# Patient Record
Sex: Female | Born: 1985 | Race: White | Hispanic: No | Marital: Single | Smoking: Former smoker
Health system: Southern US, Community
[De-identification: ages and names within clinical notes are randomized; demographics above are authoritative.]

## PROBLEM LIST (undated history)

## (undated) DIAGNOSIS — I82409 Acute embolism and thrombosis of unspecified deep veins of unspecified lower extremity: Secondary | ICD-10-CM

---

## 2011-09-03 ENCOUNTER — Emergency Department (HOSPITAL_BASED_OUTPATIENT_CLINIC_OR_DEPARTMENT_OTHER)
Admission: EM | Admit: 2011-09-03 | Discharge: 2011-09-03 | Disposition: A | Discharge status: Home / Self Care | Payer: Medicaid Other | Attending: Emergency Medicine | Admitting: Emergency Medicine

## 2011-09-03 ENCOUNTER — Emergency Department (HOSPITAL_BASED_OUTPATIENT_CLINIC_OR_DEPARTMENT_OTHER): Payer: Medicaid Other

## 2011-09-03 ENCOUNTER — Encounter (HOSPITAL_BASED_OUTPATIENT_CLINIC_OR_DEPARTMENT_OTHER): Payer: Self-pay | Admitting: *Deleted

## 2011-09-03 DIAGNOSIS — R0789 Other chest pain: Secondary | ICD-10-CM

## 2011-09-03 DIAGNOSIS — R079 Chest pain, unspecified: Secondary | ICD-10-CM | POA: Insufficient documentation

## 2011-09-03 HISTORY — DX: Acute embolism and thrombosis of unspecified deep veins of unspecified lower extremity: I82.409

## 2011-09-03 LAB — CBC WITH DIFFERENTIAL/PLATELET
Basophils Absolute: 0 10*3/uL (ref 0.0–0.1)
Lymphocytes Relative: 35 % (ref 12–46)
Lymphs Abs: 3.1 10*3/uL (ref 0.7–4.0)
Neutro Abs: 4.5 10*3/uL (ref 1.7–7.7)
Platelets: 217 10*3/uL (ref 150–400)
RBC: 4.6 MIL/uL (ref 3.87–5.11)
RDW: 12.9 % (ref 11.5–15.5)
WBC: 8.8 10*3/uL (ref 4.0–10.5)

## 2011-09-03 LAB — BASIC METABOLIC PANEL
CO2: 25 mEq/L (ref 19–32)
Chloride: 104 mEq/L (ref 96–112)
Potassium: 3.7 mEq/L (ref 3.5–5.1)
Sodium: 139 mEq/L (ref 135–145)

## 2011-09-03 LAB — D-DIMER, QUANTITATIVE: D-Dimer, Quant: 0.32 ug/mL-FEU (ref 0.00–0.48)

## 2011-09-03 LAB — TROPONIN I: Troponin I: <0.3 ng/mL (ref <0.30)

## 2011-09-03 LAB — PREGNANCY, URINE: Preg Test, Ur: NEGATIVE

## 2011-09-03 MED ORDER — IBUPROFEN 600 MG PO TABS: 600.0000 mg | ORAL_TABLET | Freq: Four times a day (QID) | ORAL | Status: AC | PRN

## 2011-09-03 NOTE — ED Notes (Signed)
Pt reports sudden onset left chest pains, directly underneath left breast, that began approx 14hours ago. States she was sitting at the time of onset. Worsens with deep breathing. Has been constant. Denies any other associated symptoms. Pt has been doing increased heavy lifting and strenuous activity over the past two weeks.

## 2011-09-03 NOTE — ED Provider Notes (Signed)
History     CSN: [id number]  Arrival date & time 09/03/11  0554   None     Chief Complaint  Patient presents with  . Chest Pain    (Consider location/radiation/quality/duration/timing/severity/associated sxs/prior treatment) Patient is a 26 y.o. female presenting with chest pain. The history is provided by the patient.  Chest Pain The chest pain began 12 - 24 hours ago. Duration of episode(s) is 14 hours. Chest pain occurs constantly. The chest pain is unchanged. The pain is associated with breathing. At its most intense, the pain is at 2/10. The pain is currently at 2/10. The severity of the pain is mild. The quality of the pain is described as sharp. The pain does not radiate. Chest pain is worsened by deep breathing. Pertinent negatives for primary symptoms include no shortness of breath, no cough and no palpitations.  Pertinent negatives for associated symptoms include no lower extremity edema. She tried nothing for the symptoms. Risk factors include obesity.  Pertinent negatives for past medical history include no aneurysm.  Pertinent negatives for family medical history include: no Marfan's syndrome in family.  Procedure history is negative for cardiac catheterization.   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    No past medical history on file.  No past surgical history on file.  No family history on file.  History  Substance Use Topics  . Smoking status: Not on file  . Smokeless tobacco: Not on file  . Alcohol Use: Not on file    OB History    No data available      Review of Systems  Respiratory: Negative for cough and shortness of breath.   Cardiovascular: Positive for chest pain. Negative for palpitations and leg swelling.  All other systems reviewed and are negative.    Allergies  Review of patient's allergies indicates not on file.  Home Medications  No current  outpatient prescriptions on file.  There were no vitals taken for this visit.  Physical Exam  Constitutional: She is oriented to person, place, and time. She appears well-developed and well-nourished.  HENT:  Head: Normocephalic and atraumatic.  Mouth/Throat: Oropharynx is clear and moist.  Eyes: Conjunctivae are normal. Pupils are equal, round, and reactive to light.  Neck: Normal range of motion. Neck supple.  Cardiovascular: Normal rate and regular rhythm.   Pulmonary/Chest: Effort normal and breath sounds normal. She has no wheezes. She has no rales.  Abdominal: Soft. Bowel sounds are normal. There is no tenderness. There is no rebound and no guarding.  Musculoskeletal: Normal range of motion.  Neurological: She is alert and oriented to person, place, and time.  Skin: Skin is warm and dry. She is not diaphoretic.  Psychiatric: She has a normal mood and affect.    ED Course  Procedures (including critical care time)   Labs Reviewed  PREGNANCY, URINE  CBC WITH DIFFERENTIAL  BASIC METABOLIC PANEL  TROPONIN I  D-DIMER, QUANTITATIVE   No results found.   No diagnosis found.    MDM  MSK pain given history, and exam. Given the onset of symptoms constant > 8 hours one negative EKG and one negative troponin are sufficient to r/o ACS.  Follow up with your family doctor return for chest pain shortness of breath fevers cough or any concerns.  Patient verbalizes understanding and agrees to follow up    Date: 09/03/2011  Rate: 72  Rhythm: normal sinus rhythm  QRS Axis: normal  Intervals: normal  ST/T Wave abnormalities: normal  Conduction Disutrbances: none  Narrative Interpretation: unremarkable         Kelina Beauchamp K Jermichael Belmares-Rasch, MD 09/03/11 [phone]

## 2011-09-03 NOTE — ED Notes (Signed)
Return from XR

## 2011-09-21 ENCOUNTER — Encounter (HOSPITAL_BASED_OUTPATIENT_CLINIC_OR_DEPARTMENT_OTHER): Payer: Self-pay | Admitting: *Deleted

## 2011-09-21 ENCOUNTER — Emergency Department (HOSPITAL_BASED_OUTPATIENT_CLINIC_OR_DEPARTMENT_OTHER)
Admission: EM | Admit: 2011-09-21 | Discharge: 2011-09-21 | Disposition: A | Discharge status: Home / Self Care | Payer: Medicaid Other | Attending: Emergency Medicine | Admitting: Emergency Medicine

## 2011-09-21 DIAGNOSIS — Z87891 Personal history of nicotine dependence: Secondary | ICD-10-CM | POA: Insufficient documentation

## 2011-09-21 DIAGNOSIS — Z86718 Personal history of other venous thrombosis and embolism: Secondary | ICD-10-CM | POA: Insufficient documentation

## 2011-09-21 DIAGNOSIS — H81399 Other peripheral vertigo, unspecified ear: Secondary | ICD-10-CM | POA: Insufficient documentation

## 2011-09-21 LAB — URINALYSIS, ROUTINE W REFLEX MICROSCOPIC
Bilirubin Urine: NEGATIVE
Leukocytes, UA: NEGATIVE
Nitrite: NEGATIVE
Specific Gravity, Urine: 1.031 — ABNORMAL HIGH (ref 1.005–1.030)
Urobilinogen, UA: 0.2 mg/dL (ref 0.0–1.0)

## 2011-09-21 LAB — GLUCOSE, CAPILLARY: Glucose-Capillary: 104 mg/dL — ABNORMAL HIGH (ref 70–99)

## 2011-09-21 LAB — PREGNANCY, URINE: Preg Test, Ur: NEGATIVE

## 2011-09-21 MED ORDER — MECLIZINE HCL 50 MG PO TABS: 50.0000 mg | ORAL_TABLET | Freq: Three times a day (TID) | ORAL | Status: AC | PRN

## 2011-09-21 MED ORDER — SODIUM CHLORIDE 0.9 % IV BOLUS (SEPSIS)
1000.0000 mL | Freq: Once | INTRAVENOUS | Status: AC
  Administered 2011-09-21: 1000 mL via INTRAVENOUS

## 2011-09-21 MED ORDER — DIAZEPAM 5 MG/ML IJ SOLN
5.0000 mg | Freq: Once | INTRAMUSCULAR | Status: DC
  Filled 2011-09-21: qty 2

## 2011-09-21 NOTE — ED Provider Notes (Signed)
History     CSN: [id number]  Arrival date & time 09/21/11  0035   First MD Initiated Contact with Patient 09/21/11 0149      Chief Complaint  Patient presents with  . Dizziness    (Consider location/radiation/quality/duration/timing/severity/associated sxs/prior treatment) HPI This is a 26 year old white female who had the sudden onset of dizziness after lying down to go to sleep. This occurred just prior to arrival. She describes the dizziness as "things moving to the left". It was accompanied by nausea but no vomiting. She felt hot. It was exacerbated by movements of the head. She had 2 acute episodes with lesser symptoms persisting. She did have an episode of diarrhea subsequently. She has no history of vertigo. She denies ear pain or tinnitus at this time but states she did have some left ear pain several days ago. Yesterday afternoon she felt unusually thirsty. Her blood sugar was checked here and I be 105. She has no history of diabetes.  Past Medical History  Diagnosis Date  . DVT (deep venous thrombosis)     Past Surgical History  Procedure Date  . Cesarean section   . Cesarean section     History reviewed. No pertinent family history.  History  Substance Use Topics  . Smoking status: Former Games developer  . Smokeless tobacco: Not on file  . Alcohol Use: No    OB History    Grav Para Term Preterm Abortions TAB SAB Ect Mult Living                  Review of Systems  All other systems reviewed and are negative.    Allergies  Review of patient's allergies indicates no known allergies.  Home Medications  No current outpatient prescriptions on file.  BP 135/72  Pulse 89  Temp 97.9 F (36.6 C)  Resp 18  Ht 5\' 4"  (1.626 m)  Wt 400 lb (181.439 kg)  BMI 68.66 kg/m2  SpO2 99%  LMP 08/13/2011  Physical Exam General: Well-developed, obese female in no acute distress; appearance consistent with age of record HENT: normocephalic, atraumatic; normal-appearing  tympanic membranes Eyes: pupils equal round and reactive to light; extraocular muscles intact; no metastatic Neck: supple Heart: regular rate and rhythm Lungs: clear to auscultation bilaterally Abdomen: soft; nondistended Extremities: No deformity; full range of motion Neurologic: Awake, alert and oriented; motor function intact in all extremities and symmetric; no facial droop; normal coordination speech Skin: Warm and dry Psychiatric: Normal mood and affect    ED Course  Procedures (including critical care time)     MDM   Nursing notes and vitals signs, including pulse oximetry, reviewed.  Summary of this visit's results, reviewed by myself:  Labs:  Results for orders placed during the hospital encounter of 09/21/11  URINALYSIS, ROUTINE W REFLEX MICROSCOPIC      Component Value Range   Color, Urine YELLOW  YELLOW   APPearance CLEAR  CLEAR   Specific Gravity, Urine 1.031 (*) 1.005 - 1.030   pH 5.0  5.0 - 8.0   Glucose, UA NEGATIVE  NEGATIVE mg/dL   Hgb urine dipstick NEGATIVE  NEGATIVE   Bilirubin Urine NEGATIVE  NEGATIVE   Ketones, ur NEGATIVE  NEGATIVE mg/dL   Protein, ur NEGATIVE  NEGATIVE mg/dL   Urobilinogen, UA 0.2  0.0 - 1.0 mg/dL   Nitrite NEGATIVE  NEGATIVE   Leukocytes, UA NEGATIVE  NEGATIVE  GLUCOSE, CAPILLARY      Component Value Range   Glucose-Capillary 104 (*) 70 -  99 mg/dL   0:98 AM Asymptomatic at this time after Valium 5 mg IV.         Hanley Seamen, MD 09/21/11 [phone]

## 2011-09-21 NOTE — ED Notes (Signed)
Pt c/o dizzy, " hot flashes" diarrhea x 30 mins

## 2011-12-12 ENCOUNTER — Encounter (HOSPITAL_BASED_OUTPATIENT_CLINIC_OR_DEPARTMENT_OTHER): Payer: Self-pay | Admitting: *Deleted

## 2011-12-12 ENCOUNTER — Emergency Department (HOSPITAL_BASED_OUTPATIENT_CLINIC_OR_DEPARTMENT_OTHER)
Admission: EM | Admit: 2011-12-12 | Discharge: 2011-12-12 | Disposition: A | Discharge status: Home / Self Care | Payer: Medicaid Other | Attending: Emergency Medicine | Admitting: Emergency Medicine

## 2011-12-12 DIAGNOSIS — R232 Flushing: Secondary | ICD-10-CM

## 2011-12-12 DIAGNOSIS — Z79899 Other long term (current) drug therapy: Secondary | ICD-10-CM | POA: Insufficient documentation

## 2011-12-12 DIAGNOSIS — Z86718 Personal history of other venous thrombosis and embolism: Secondary | ICD-10-CM | POA: Insufficient documentation

## 2011-12-12 DIAGNOSIS — Z87891 Personal history of nicotine dependence: Secondary | ICD-10-CM | POA: Insufficient documentation

## 2011-12-12 LAB — URINE MICROSCOPIC-ADD ON

## 2011-12-12 LAB — CBC WITH DIFFERENTIAL/PLATELET
Eosinophils Relative: 2 % (ref 0–5)
HCT: 36.8 % (ref 36.0–46.0)
Lymphocytes Relative: 35 % (ref 12–46)
Lymphs Abs: 2.7 10*3/uL (ref 0.7–4.0)
MCV: 89.3 fL (ref 78.0–100.0)
Monocytes Absolute: 0.8 10*3/uL (ref 0.1–1.0)
RBC: 4.12 MIL/uL (ref 3.87–5.11)
RDW: 12.8 % (ref 11.5–15.5)
WBC: 7.6 10*3/uL (ref 4.0–10.5)

## 2011-12-12 LAB — BASIC METABOLIC PANEL
CO2: 24 mEq/L (ref 19–32)
Calcium: 8.9 mg/dL (ref 8.4–10.5)
Creatinine, Ser: 0.8 mg/dL (ref 0.50–1.10)
Glucose, Bld: 92 mg/dL (ref 70–99)

## 2011-12-12 LAB — URINALYSIS, ROUTINE W REFLEX MICROSCOPIC
Ketones, ur: 15 mg/dL — AB
Nitrite: NEGATIVE
Protein, ur: NEGATIVE mg/dL
pH: 5.5 (ref 5.0–8.0)

## 2011-12-12 NOTE — ED Notes (Signed)
Pt states for the past 2 weeks, she has had 3 episodes of feeling dizzy, "flashes" of heat, nausea and near syncope. @ of these happened after a high carb meal and drinking OJ. Worried she was a diabetic. Monitored her sugars, but they were OK. Also they happened 2 days after starting Medroxyprogesterone. Only took for 5 days. Tonight she could not sleep. Felt anxious.

## 2011-12-12 NOTE — ED Provider Notes (Addendum)
History     CSN: [id number]  Arrival date & time 12/12/11  1308   First MD Initiated Contact with Patient 12/12/11 0100      Chief Complaint  Patient presents with  . Dizziness    (Consider location/radiation/quality/duration/timing/severity/associated sxs/prior treatment) The history is provided by the patient. No language interpreter was used.  Patient describes lightheadedness for 5-7 days following laying down.  Usually after a high carb meal.  Patient has concern that she is diabetic.  No polyuria, nor polydipsia.  No CP, no SOB no DOE.  Symptoms no not happen when the patient is ambulating.  Feels hot all over and like she is flushed.  No cough.  No weakness no numbness.  No rashes on the skin.  No travel.  No vomiting no diarrhea.  No urinary symptoms.  Stated she had been checking her sugars at home and they were normal.    Past Medical History  Diagnosis Date  . DVT (deep venous thrombosis)     Past Surgical History  Procedure Date  . Cesarean section   . Cesarean section     History reviewed. No pertinent family history.  History  Substance Use Topics  . Smoking status: Former Games developer  . Smokeless tobacco: Not on file  . Alcohol Use: No    OB History    Grav Para Term Preterm Abortions TAB SAB Ect Mult Living                  Review of Systems  Constitutional: Negative for chills and diaphoresis.  HENT: Negative for neck pain and neck stiffness.   Respiratory: Negative for shortness of breath and wheezing.   Cardiovascular: Negative for chest pain and leg swelling.  Skin: Negative for rash.  Neurological: Positive for light-headedness. Negative for facial asymmetry, weakness, numbness and headaches.  All other systems reviewed and are negative.    Allergies  Review of patient's allergies indicates no known allergies.  Home Medications   Current Outpatient Rx  Name  Route  Sig  Dispense  Refill  . MEDROXYPROGESTERONE ACETATE 10 MG PO TABS    Oral   Take 20 mg by mouth daily.           BP 130/70  Pulse 75  Temp 97.8 F (36.6 C)  Resp 20  Ht 5\' 5"  (1.651 m)  Wt 400 lb (181.439 kg)  BMI 66.56 kg/m2  SpO2 100%  LMP 12/12/2011  Physical Exam  Constitutional: She is oriented to person, place, and time. She appears well-developed and well-nourished. No distress.  HENT:  Head: Normocephalic and atraumatic.  Right Ear: Tympanic membrane is not injected.  Left Ear: Tympanic membrane is not injected.  Mouth/Throat: Oropharynx is clear and moist.  Eyes: Conjunctivae normal and EOM are normal. Pupils are equal, round, and reactive to light.  Neck: Normal range of motion. Neck supple.  Cardiovascular: Normal rate, regular rhythm and intact distal pulses.   Pulmonary/Chest: Effort normal and breath sounds normal. She has no wheezes. She has no rales.  Abdominal: Soft. Bowel sounds are normal. There is no tenderness. There is no rebound and no guarding.  Musculoskeletal: Normal range of motion.  Neurological: She is alert and oriented to person, place, and time. She has normal reflexes.  Skin: Skin is warm and dry.    ED Course  Procedures (including critical care time)  Labs Reviewed  URINALYSIS, ROUTINE W REFLEX MICROSCOPIC - Abnormal; Notable for the following:    Color, Urine AMBER (*)  BIOCHEMICALS MAY BE AFFECTED BY COLOR   APPearance TURBID (*)     Hgb urine dipstick LARGE (*)     Bilirubin Urine SMALL (*)     Ketones, ur 15 (*)     All other components within normal limits  URINE MICROSCOPIC-ADD ON - Abnormal; Notable for the following:    Squamous Epithelial / LPF MANY (*)     Bacteria, UA MANY (*)     All other components within normal limits  BASIC METABOLIC PANEL - Abnormal; Notable for the following:    Potassium 3.4 (*)     All other components within normal limits  PREGNANCY, URINE  GLUCOSE, CAPILLARY  CBC WITH DIFFERENTIAL  URINE CULTURE   No results found.   1. Vasomotor flushing        MDM   Date: 12/12/2011  Rate:72  Rhythm: normal sinus rhythm  QRS Axis: normal  Intervals: normal  ST/T Wave abnormalities: normal  Conduction Disutrbances: none  Narrative Interpretation: unremarkable  Suspect anxiety as cause. No indication for imaging at this time.  Return for weakness numbness changes in speech. Chest pain shortness of breath or any concerns.    Case d/w Dr. Lovell Sheehan, call office in am to be seen         Taitum Alms K Allani Reber-Rasch, MD 12/12/11 0235  Loda Bialas K Dyllon Henken-Rasch, MD 12/12/11 0236

## 2011-12-12 NOTE — ED Notes (Signed)
MD at bedside. 

## 2011-12-13 LAB — URINE CULTURE

## 2011-12-14 NOTE — ED Notes (Signed)
+   Urine Chart sent to EDP office for review. 

## 2011-12-18 NOTE — ED Notes (Signed)
Chart returned from EDP office. Per [employer] PA-C, not a clean catch--Diptheioids are likely contaminant. No treatment.

## 2012-12-18 ENCOUNTER — Emergency Department (HOSPITAL_BASED_OUTPATIENT_CLINIC_OR_DEPARTMENT_OTHER)
Admission: EM | Admit: 2012-12-18 | Discharge: 2012-12-18 | Disposition: A | Discharge status: Home / Self Care | Payer: Medicaid Other | Attending: Emergency Medicine | Admitting: Emergency Medicine

## 2012-12-18 ENCOUNTER — Encounter (HOSPITAL_BASED_OUTPATIENT_CLINIC_OR_DEPARTMENT_OTHER): Payer: Self-pay | Admitting: Emergency Medicine

## 2012-12-18 DIAGNOSIS — Z8669 Personal history of other diseases of the nervous system and sense organs: Secondary | ICD-10-CM | POA: Insufficient documentation

## 2012-12-18 DIAGNOSIS — Z86718 Personal history of other venous thrombosis and embolism: Secondary | ICD-10-CM | POA: Insufficient documentation

## 2012-12-18 DIAGNOSIS — R209 Unspecified disturbances of skin sensation: Secondary | ICD-10-CM | POA: Insufficient documentation

## 2012-12-18 DIAGNOSIS — R202 Paresthesia of skin: Secondary | ICD-10-CM

## 2012-12-18 DIAGNOSIS — Z8739 Personal history of other diseases of the musculoskeletal system and connective tissue: Secondary | ICD-10-CM | POA: Insufficient documentation

## 2012-12-18 DIAGNOSIS — H532 Diplopia: Secondary | ICD-10-CM | POA: Insufficient documentation

## 2012-12-18 NOTE — ED Notes (Addendum)
Left side facial numbness x 1 week-also c/o blurred vision x 1 year

## 2012-12-18 NOTE — ED Provider Notes (Addendum)
CSN: [id number]     Arrival date & time 12/18/12  2118 History   First MD Initiated Contact with Patient 12/18/12 2141     This chart was scribed for Rolan Bucco, MD by Manuela Schwartz, ED scribe. This patient was seen in room MH12/MH12 and the patient's care was started at 2141.  Chief Complaint  Patient presents with  . Numbness   The history is provided by the patient. No language interpreter was used.   HPI Comments: Felicia Michael is a 27 y.o. female who presents to the Emergency Department complaining of intermittent paraesthesias over the left side of her face which radiates to her left chin and left forehead, onset 2 weeks ago and worsened this PM. She states has not had this problem before and denies any injury or trauma to her face. She denies associated drooling, change in speech, facial drooping, off-balance issues, numbness or weakness of her arms/legs, HA. She states her sx are worse when laying down on her back but better when she is up and moving around or being active. She reports frequent ear infections but states her ear currently feels fine. 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  SHe has had some intermittent double vision that started one year ago after "hormone therapy".  No vision changes currently.  PCP Dr. Lovell Sheehan   Past Medical History  Diagnosis Date  . DVT (deep venous thrombosis)    Past Surgical History  Procedure Laterality Date  . Cesarean section    . Cesarean section     No family history on file. History  Substance Use Topics  . Smoking status: Former Games developer  . Smokeless tobacco: Not on file  . Alcohol Use: No   OB History   Grav Para Term Preterm Abortions TAB SAB Ect Mult Living                 Review of Systems  Constitutional: Negative for fever and chills.  HENT: Negative for congestion and rhinorrhea.   Respiratory: Negative for cough and shortness of breath.    Cardiovascular: Negative for chest pain.  Gastrointestinal: Negative for nausea, vomiting, abdominal pain and diarrhea.  Musculoskeletal: Negative for back pain.  Skin: Negative for color change and rash.  Neurological: Positive for numbness (parasthesias over her left face). Negative for dizziness, syncope, facial asymmetry, speech difficulty, weakness and headaches.  All other systems reviewed and are negative.   A complete 10 system review of systems was obtained and all systems are negative except as noted in the HPI and PMH.   Allergies  Review of patient's allergies indicates no known allergies.  Home Medications   Current Outpatient Rx  Name  Route  Sig  Dispense  Refill  . medroxyPROGESTERone (PROVERA) 10 MG tablet   Oral   Take 20 mg by mouth daily.          Triage Vitals: BP 161/68  Pulse 89  Temp(Src) 97.9 F (36.6 C) (Oral)  Resp 16  Ht 5\' 5"  (1.651 m)  Wt 390 lb (176.903 kg)  BMI 64.90 kg/m2  SpO2 100%  LMP 11/27/2012 Physical Exam  Nursing note and vitals reviewed. Constitutional: She is oriented to person, place, and time. She appears well-developed and well-nourished.  HENT:  Head: Normocephalic and atraumatic.  Eyes: Pupils are equal, round, and reactive to light.  Neck: Normal range of motion. Neck supple.  Cardiovascular: Normal rate,  regular rhythm and normal heart sounds.   Pulmonary/Chest: Effort normal and breath sounds normal. No respiratory distress. She has no wheezes. She has no rales. She exhibits no tenderness.  Abdominal: Soft. Bowel sounds are normal. There is no tenderness. There is no rebound and no guarding.  Musculoskeletal: Normal range of motion. She exhibits no edema.  Lymphadenopathy:    She has no cervical adenopathy.  Neurological: She is alert and oriented to person, place, and time.  Motor 5/5 all extremities, sensation grossly intact to LT all extremities.  No pronator drift.  FTN intact.  No facial drooping.  Normal  sensation to face to LT.  Gait normal  Skin: Skin is warm and dry. No rash noted.  Psychiatric: She has a normal mood and affect.    ED Course  Procedures (including critical care time) DIAGNOSTIC STUDIES: Oxygen Saturation is 100% on room air, normal by my interpretation.    COORDINATION OF CARE: At 950 PM PM Discussed treatment plan with patient which includes plan to d/c home and f/u w/her PCP. Patient agrees.   Labs Review Labs Reviewed - No data to display Imaging Review No results found.  EKG Interpretation   None       MDM   1. Paresthesia    Pt with intermittent tingling to left face, at times involves forehead.  No assoicated headaches.  No other associated neuro deficits.  Feel that this is likely a peripheral nerve irritation.  Nothing suggestive of CVA, other intracranial process.  Advised to f/lu with her PMD, if symptoms continue or worsen, may need MRI, but don't feel that this is indicated at this point.  I personally performed the services described in this documentation, which was scribed in my presence.  The recorded information has been reviewed and considered.      Rolan Bucco, MD 12/18/12 1610  Rolan Bucco, MD 12/18/12 2218

## 2015-01-27 ENCOUNTER — Emergency Department (HOSPITAL_BASED_OUTPATIENT_CLINIC_OR_DEPARTMENT_OTHER)
Admission: EM | Admit: 2015-01-27 | Discharge: 2015-01-27 | Disposition: A | Discharge status: Home / Self Care | Payer: Self-pay | Attending: Emergency Medicine | Admitting: Emergency Medicine

## 2015-01-27 ENCOUNTER — Emergency Department (HOSPITAL_BASED_OUTPATIENT_CLINIC_OR_DEPARTMENT_OTHER): Payer: Self-pay

## 2015-01-27 ENCOUNTER — Encounter (HOSPITAL_BASED_OUTPATIENT_CLINIC_OR_DEPARTMENT_OTHER): Payer: Self-pay

## 2015-01-27 DIAGNOSIS — Z87891 Personal history of nicotine dependence: Secondary | ICD-10-CM | POA: Insufficient documentation

## 2015-01-27 DIAGNOSIS — M79605 Pain in left leg: Secondary | ICD-10-CM

## 2015-01-27 DIAGNOSIS — Z86718 Personal history of other venous thrombosis and embolism: Secondary | ICD-10-CM | POA: Insufficient documentation

## 2015-01-27 DIAGNOSIS — M79662 Pain in left lower leg: Secondary | ICD-10-CM | POA: Insufficient documentation

## 2015-01-27 DIAGNOSIS — Z79891 Long term (current) use of opiate analgesic: Secondary | ICD-10-CM | POA: Insufficient documentation

## 2015-01-27 NOTE — Discharge Instructions (Signed)

## 2015-01-27 NOTE — ED Provider Notes (Signed)
CSN: [id number]     Arrival date & time 01/27/15  [id number] History   First MD Initiated Contact with Patient 01/27/15 0630     Chief Complaint  Patient presents with  . Leg Pain     (Consider location/radiation/quality/duration/timing/severity/associated sxs/prior Treatment) HPI Comments: Patient is a 29 year old female with history of morbid obesity and prior DVT. She presents with complaints of pain in her left calf that started 2 days ago. She reports she was riding in the car for several hours over the weekend. She reports experiencing some swelling in both legs, however is now having pain in her left calf. She denies to me she is expressing any chest pain or shortness of breath. She denies any other injury or trauma.  Patient is a 29 y.o. female presenting with leg pain. The history is provided by the patient.  Leg Pain Lower extremity pain location: Calf. Time since incident:  2 days Injury: no   Pain details:    Quality:  Cramping   Radiates to:  Does not radiate   Severity:  Moderate   Timing:  Constant   Progression:  Unchanged Chronicity:  New Relieved by:  Nothing Worsened by:  Activity and bearing weight   Past Medical History  Diagnosis Date  . DVT (deep venous thrombosis) [facility](HCC)    Past Surgical History  Procedure Laterality Date  . Cesarean section    . Cesarean section     No family history on file. Social History  Substance Use Topics  . Smoking status: Former Games developermoker  . Smokeless tobacco: None  . Alcohol Use: No   OB History    No data available     Review of Systems  All other systems reviewed and are negative.     Allergies  Review of patient's allergies indicates no known allergies.  Home Medications   Prior to Admission medications   Medication Sig Start Date End Date Taking? Authorizing Provider  medroxyPROGESTERone (PROVERA) 10 MG tablet Take 20 mg by mouth daily.    Historical Provider, MD   BP 115/99 mmHg  Pulse 74  Temp(Src) 98 F  (36.7 C) (Oral)  Resp 22  Wt 431 lb (195.5 kg)  SpO2 100% Physical Exam  Constitutional: She is oriented to person, place, and time. She appears well-developed and well-nourished. No distress.  HENT:  Head: Normocephalic and atraumatic.  Neck: Normal range of motion. Neck supple.  Cardiovascular: Normal rate and regular rhythm.  Exam reveals no gallop and no friction rub.   No murmur heard. Pulmonary/Chest: Effort normal and breath sounds normal. No respiratory distress. She has no wheezes.  Abdominal: Soft. Bowel sounds are normal. She exhibits no distension. There is no tenderness.  Musculoskeletal: Normal range of motion. She exhibits no edema.  There is mild tenderness to the posterior aspect of the left calf. There is no other obvious abnormality. DP pulses are palpable bilaterally. Homans sign is absent bilaterally. The leg exam is somewhat obscured secondary to obesity.  Neurological: She is alert and oriented to person, place, and time.  Skin: Skin is warm and dry. She is not diaphoretic.  Nursing note and vitals reviewed.   ED Course  Procedures (including critical care time) Labs Review Labs Reviewed - No data to display  Imaging Review No results found. I have personally reviewed and evaluated these images and lab results as part of my medical decision-making.   EKG Interpretation None      MDM   Final diagnoses:  None  Patient will remain in the emergency department for an ultrasound. The ultrasound techs come in today at 8 AM. Care will be signed out to the oncoming provider at shift change who will obtain the results of this study and determine the appropriate treatment and disposition.    Geoffery Lyons, MD 01/28/15 [phone]

## 2015-01-27 NOTE — ED Notes (Signed)
Left lower calf pain since Friday.  States her ankles were really swollen on Thursday and the next day she had the pain.  She did drive 6 hours in the car without stopping on Thursday, has a hx of DVTs.  Area of pain is warm, no significant swelling appreciated, distal pulses present.

## 2015-02-24 ENCOUNTER — Encounter (HOSPITAL_BASED_OUTPATIENT_CLINIC_OR_DEPARTMENT_OTHER): Payer: Self-pay | Admitting: *Deleted

## 2015-02-24 ENCOUNTER — Emergency Department (HOSPITAL_BASED_OUTPATIENT_CLINIC_OR_DEPARTMENT_OTHER): Payer: Self-pay

## 2015-02-24 ENCOUNTER — Emergency Department (HOSPITAL_BASED_OUTPATIENT_CLINIC_OR_DEPARTMENT_OTHER)
Admission: EM | Admit: 2015-02-24 | Discharge: 2015-02-24 | Disposition: A | Discharge status: Home / Self Care | Payer: Self-pay | Attending: Emergency Medicine | Admitting: Emergency Medicine

## 2015-02-24 DIAGNOSIS — Z793 Long term (current) use of hormonal contraceptives: Secondary | ICD-10-CM | POA: Insufficient documentation

## 2015-02-24 DIAGNOSIS — I809 Phlebitis and thrombophlebitis of unspecified site: Secondary | ICD-10-CM

## 2015-02-24 DIAGNOSIS — M79669 Pain in unspecified lower leg: Secondary | ICD-10-CM

## 2015-02-24 DIAGNOSIS — I8002 Phlebitis and thrombophlebitis of superficial vessels of left lower extremity: Secondary | ICD-10-CM | POA: Insufficient documentation

## 2015-02-24 DIAGNOSIS — Z86718 Personal history of other venous thrombosis and embolism: Secondary | ICD-10-CM | POA: Insufficient documentation

## 2015-02-24 DIAGNOSIS — Z87891 Personal history of nicotine dependence: Secondary | ICD-10-CM | POA: Insufficient documentation

## 2015-02-24 NOTE — ED Provider Notes (Addendum)
CSN: [id number]     Arrival date & time 02/24/15  0907 History   First MD Initiated Contact with Patient 02/24/15 [phone]     Chief Complaint  Patient presents with  . 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 She was seen in the ER proximal leg 1 month ago and had a negative DVT study. Since that time she has been using warm compresses, ibuprofen without resolution of her pain. She has had more notable pain and swelling in the ankle area with improvement of the calf pain over the last week. She denies fever, chest pain, shortness of breath. She does not use compression stocking  Patient is a 30 y.o. female presenting with leg pain. The history is provided by the patient.  Leg Pain Location:  Ankle and leg Time since incident:  1 month Injury: no   Leg location:  L lower leg Ankle location:  L ankle Pain details:    Quality:  Aching and shooting   Radiates to:  Does not radiate   Severity:  Moderate   Onset quality:  Gradual   Timing:  Constant   Progression:  Improving Chronicity:  Recurrent Relieved by:  NSAIDs (warm compresses) Worsened by:  Activity Associated symptoms: swelling   Associated symptoms: no decreased ROM, no muscle weakness and no numbness   Risk factors comment:  Prior DVT x2 in the left leg.   Past Medical History  Diagnosis Date  . DVT (deep venous thrombosis) [facility])    Past Surgical History  Procedure Laterality Date  . Cesarean section    . Cesarean section     No family history on file. Social History  Substance Use Topics  . Smoking status: Former Games developer  . Smokeless tobacco: None  . Alcohol Use: No   OB History    No data available     Review of Systems    All other systems reviewed and are negative.     Allergies  Review of patient's allergies indicates no known allergies.  Home Medications   Prior to Admission medications   Medication Sig Start Date End Date Taking? Authorizing Provider  medroxyPROGESTERone (PROVERA) 10 MG tablet Take 20 mg by mouth daily.    Historical Provider, MD   BP 172/85 mmHg  Pulse 85  Temp(Src) 98.3 F (36.8 C) (Oral)  Resp 20  Ht  (1.651 m)  Wt 430 lb (195.047 kg)  BMI 71.56 kg/m2  SpO2 99% Physical Exam  Constitutional: She is oriented to person, place, and time. She appears well-developed and well-nourished. No distress.  Morbidly obese  HENT:  Head: Normocephalic and atraumatic.  Eyes: EOM are normal. Pupils are equal, round, and reactive to light.  Cardiovascular: Normal rate.   Pulmonary/Chest: Effort normal.  Musculoskeletal: She exhibits tenderness.       Legs: Mild tenderness present in the left calf without notable swelling. Significant tenderness in the left lateral malleolus area with some tissue induration and swelling. 2+ DP and PT pulses noted. No warmth or erythema  Neurological: She is alert and oriented to person, place, and time.  Skin: Skin is warm and dry. No rash noted. No erythema.  Psychiatric: She has a normal mood and affect. Her behavior is normal.  Nursing note  and vitals reviewed.   ED Course  Procedures (including critical care time) Labs Review Labs Reviewed - No data to display  Imaging Review US Venous Img Lower Unilateral Left  02/24/2015  CLINICAL DATA:  Left calf pain radiating to the ankle since 12/23. History of prior DVT. Evaluate for acute or chronic DVT. EXAM: LEFT LOWER EXTREMITY VENOUS DOPPLER ULTRASOUND TECHNIQUE: Gray-scale sonography with graded compression, as well as color Doppler and duplex ultrasound were performed to evaluate the lower extremity deep venous systems from the level of the common femoral vein and including the common  femoral, femoral, profunda femoral, popliteal and calf veins including the posterior tibial, peroneal and gastrocnemius veins when visible. The superficial great saphenous vein was also interrogated. Spectral Doppler was utilized to evaluate flow at rest and with distal augmentation maneuvers in the common femoral, femoral and popliteal veins. COMPARISON:  None. FINDINGS: Contralateral Common Femoral Vein: Respiratory phasicity is normal and symmetric with the symptomatic side. No evidence of thrombus. Normal compressibility. Common Femoral Vein: No evidence of thrombus. Normal compressibility, respiratory phasicity and response to augmentation. Saphenofemoral Junction: No evidence of thrombus. Normal compressibility and flow on color Doppler imaging. Profunda Femoral Vein: No evidence of thrombus. Normal compressibility and flow on color Doppler imaging. Femoral Vein: No evidence of thrombus. Normal compressibility, respiratory phasicity and response to augmentation. Popliteal Vein: No evidence of thrombus. Normal compressibility, respiratory phasicity and response to augmentation. Calf Veins: No evidence of thrombus. Normal compressibility and flow on color Doppler imaging. Superficial Great Saphenous Vein: No evidence of thrombus. Normal compressibility and flow on color Doppler imaging. Venous Reflux:  None. Other Findings: There is echogenic occlusive thrombus within the lesser saphenous vein extending from the level of the mid calf to the ankle. Note is made of several echogenic foci within the distal aspect of the thrombus (representative image 39) suggestive of calcifications and indicative of a chronic etiology. Note, the left lesser saphenous vein was not discretely imaged on the 01/27/2015 examination. IMPRESSION: 1. No evidence of acute or chronic DVT within left lower extremity. 2. Examination is positive for age-indeterminate though potentially chronic superficial thrombophlebitis within the lesser  saphenous vein at the level of the calf and ankle. Again, there is no extension of this superficial thrombophlebitis to the deep venous system of the left lower extremity. Electronically Signed   By: Simonne Come M.D.   On: 02/24/2015 10:28   I have personally reviewed and evaluated these images and lab results as part of my medical decision-making.   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 She was seen approximately one month ago for similar symptoms that started after 6-7 hour car trip. She says since that time she's had persistent pain. She has been elevating, warm compresses and ibuprofen with some improvement but she is concerned because the pain has continued. She denies any chest pain or shortness of breath concerning for PE. Initially it was thought the DVTs related to birth control which she does not take any longer. She is not currently on anticoagulation. On exam the majority of her pain is in the left lateral ankle with only minimal calf tenderness. Feel most likely patient's symptoms are related to thrombophlebitis or vein insufficiency from prior DVTs. However due to ongoing pain and noted swelling still present  we'll repeat venous ultrasound to ensure no DVTs. Patient denies any trauma that would require x-ray  10:40 AM Thrombophlebitis is present on the DVT study but no evidence of DVT. We'll recommend the patient continue NSAIDs and start using compression hose.   Gwyneth Sprout, MD 02/24/15 1041  Gwyneth Sprout, MD 02/24/15 1041

## 2015-02-24 NOTE — ED Notes (Signed)
MD at bedside. 

## 2015-02-24 NOTE — ED Notes (Signed)
Presents with LLE pain

## 2015-02-24 NOTE — ED Notes (Signed)
Presents with LLE pain, has left lateral tenderness at Lt ankle.

## 2015-02-24 NOTE — ED Notes (Signed)
Pt denies any chest pain or shortness of breath.

## 2015-02-24 NOTE — ED Notes (Signed)
Pt resting quietly on stretcher, informed that results have been provided, awaiting EDP evaluation

## 2017-01-15 ENCOUNTER — Other Ambulatory Visit: Payer: Self-pay

## 2017-01-15 ENCOUNTER — Encounter (HOSPITAL_BASED_OUTPATIENT_CLINIC_OR_DEPARTMENT_OTHER): Payer: Self-pay | Admitting: *Deleted

## 2017-01-15 ENCOUNTER — Emergency Department (HOSPITAL_BASED_OUTPATIENT_CLINIC_OR_DEPARTMENT_OTHER)
Admission: EM | Admit: 2017-01-15 | Discharge: 2017-01-16 | Disposition: A | Discharge status: Home / Self Care | Payer: Self-pay | Attending: Emergency Medicine | Admitting: Emergency Medicine

## 2017-01-15 DIAGNOSIS — Z87891 Personal history of nicotine dependence: Secondary | ICD-10-CM | POA: Insufficient documentation

## 2017-01-15 DIAGNOSIS — Z79899 Other long term (current) drug therapy: Secondary | ICD-10-CM | POA: Insufficient documentation

## 2017-01-15 DIAGNOSIS — R002 Palpitations: Secondary | ICD-10-CM | POA: Insufficient documentation

## 2017-01-15 NOTE — ED Triage Notes (Addendum)
Reports she woke with a headache and feeling flushed. States her heart was pounding. Had similar symptoms before when here Bp was elevated. States she took baby aspirin tonight also

## 2017-01-16 LAB — CBC WITH DIFFERENTIAL/PLATELET
Basophils Absolute: 0 10*3/uL (ref 0.0–0.1)
Basophils Relative: 1 %
EOS ABS: 0.1 10*3/uL (ref 0.0–0.7)
EOS PCT: 2 %
HCT: 39.5 % (ref 36.0–46.0)
Hemoglobin: 13 g/dL (ref 12.0–15.0)
LYMPHS ABS: 2.3 10*3/uL (ref 0.7–4.0)
LYMPHS PCT: 34 %
MCH: 29.9 pg (ref 26.0–34.0)
MCHC: 32.9 g/dL (ref 30.0–36.0)
MCV: 90.8 fL (ref 78.0–100.0)
MONO ABS: 0.6 10*3/uL (ref 0.1–1.0)
Monocytes Relative: 10 %
Neutro Abs: 3.6 10*3/uL (ref 1.7–7.7)
Neutrophils Relative %: 53 %
Platelets: 210 10*3/uL (ref 150–400)
RBC: 4.35 MIL/uL (ref 3.87–5.11)
RDW: 13.1 % (ref 11.5–15.5)
WBC: 6.6 10*3/uL (ref 4.0–10.5)

## 2017-01-16 LAB — BASIC METABOLIC PANEL
Anion gap: 7 (ref 5–15)
BUN: 13 mg/dL (ref 6–20)
CALCIUM: 8.7 mg/dL — AB (ref 8.9–10.3)
CO2: 26 mmol/L (ref 22–32)
CREATININE: 0.59 mg/dL (ref 0.44–1.00)
Chloride: 105 mmol/L (ref 101–111)
GFR calc Af Amer: >60 mL/min (ref >60)
GFR calc non Af Amer: >60 mL/min (ref >60)
GLUCOSE: 99 mg/dL (ref 65–99)
POTASSIUM: 3.9 mmol/L (ref 3.5–5.1)
SODIUM: 138 mmol/L (ref 135–145)

## 2017-01-16 NOTE — ED Notes (Signed)
Alert, NAD, calm, interactive, resps e/u, speaking in clear complete sentences, no dyspnea noted, skin W&D, VSS.   Here for episode of heart racing, CP, HA, light-headed, also mentions face red and tired. Sx onset upon waking at 2300. Felt fine prior to lying down. Mentions similar episode in October in which [facility] and dx'd with a panic attack.   Admits to continued HA. (Denies: current CP, sob, NVD, fever, recent illness, dizziness or visual changes). Also denies caffeine, medications or ETOH. [facility] into room[facility]

## 2017-01-16 NOTE — ED Provider Notes (Signed)
[facility] EMERGENCY DEPARTMENT Provider Note   CSN: [id number] Arrival date & time: 01/15/17  2333     History   Chief Complaint Chief Complaint  Patient presents with  . Hypertension    HPI Felicia Michael is a 31 y.o. female.  HPI  This is a 31 year old female who presents with palpitations.  Patient reports that she was asleep when she was awoken by a sensation that her heart was racing.  She states that it lasted 30 seconds to 1 minute.  She states she had a similar episode in October which she associated with anxiety.  However, today she has not felt anxious.  She denies any increase caffeine use, new medications or supplements.  She denies any chest pain, breath, abdominal pain, nausea, vomiting.  She reports that she feels completely normal now.  She did take her blood pressure at home and noted it to be elevated; however, she questions the fit of her cuff.  Past Medical History:  Diagnosis Date  . DVT (deep venous thrombosis) (HCC)     There are no active problems to display for this patient.   Past Surgical History:  Procedure Laterality Date  . CESAREAN SECTION    . CESAREAN SECTION      OB History    No data available       Home Medications    Prior to Admission medications   Medication Sig Start Date End Date Taking? Authorizing Provider  medroxyPROGESTERone (PROVERA) 10 MG tablet Take 20 mg by mouth daily.    [provider]    Family History No family history on file.  Social History Social History   Tobacco Use  . Smoking status: Former Games developermoker  . Smokeless tobacco: Never Used  Substance Use Topics  . Alcohol use: No  . Drug use: No     Allergies   Patient has no known allergies.   Review of Systems Review of Systems  Constitutional: Negative for fever.  Respiratory: Negative for shortness of breath.   Cardiovascular: Positive for palpitations. Negative for chest pain and leg swelling.  Gastrointestinal:  Negative for abdominal pain, nausea and vomiting.  All other systems reviewed and are negative.    Physical Exam Updated Vital Signs BP (!) 106/58   Pulse 88   Temp 98.6 F (37 C) (Oral)   Resp 13   Ht 5\' 6"  (1.676 m)   Wt (!) 208.7 kg (460 lb)   SpO2 99%   BMI 74.25 kg/m   Physical Exam  Constitutional: She is oriented to person, place, and time.  Morbidly obese, no acute distress  HENT:  Head: Normocephalic and atraumatic.  Cardiovascular: Normal rate, regular rhythm and normal heart sounds.  Pulmonary/Chest: Effort normal. No respiratory distress. She has no wheezes.  Abdominal: Soft. There is no tenderness.  Neurological: She is alert and oriented to person, place, and time.  Skin: Skin is warm and dry.  Psychiatric: She has a normal mood and affect.  Nursing note and vitals reviewed.    ED Treatments / Results  Labs (all labs ordered are listed, but only abnormal results are displayed) Labs Reviewed  BASIC METABOLIC PANEL - Abnormal; Notable for the following components:      Result Value   Calcium 8.7 (*)    All other components within normal limits  CBC WITH DIFFERENTIAL/PLATELET    EKG  EKG Interpretation  Date/Time:  Monday January 16 2017 00:46:29 EST Ventricular Rate:  88 PR Interval:  QRS Duration: 90 QT Interval:  385 QTC Calculation: 466 R Axis:   48 Text Interpretation:  Sinus rhythm Low voltage, precordial leads Confirmed by Ross MarcusHorton, Ellajane Stong ([id number]) on 01/16/2017 2:19:53 AM       Radiology No results found.  Procedures Procedures (including critical care time)  Medications Ordered in ED Medications - No data to display   Initial Impression / Assessment and Plan / ED Course  I have reviewed the triage vital signs and the nursing notes.  Pertinent labs & imaging results that were available during my care of the patient were reviewed by me and considered in my medical decision making (see chart for details).     Patient  presents with palpitations.  She is currently asymptomatic.  Nontoxic appearing.  Vital signs reassuring.  EKG without evidence of arrhythmia.  Heart rate is normal.  Basic lab work obtained.  No evidence of anemia or metabolic derangement.  Given absence of chest pain or shortness of breath, doubt ACS or PE.  Patient has remained asymptomatic on the monitor in the ED.  No notable arrhythmias.  Recommend follow-up with cardiology for long-term monitoring.  Avoid increased caffeine.  Final Clinical Impressions(s) / ED Diagnoses   Final diagnoses:  Palpitations    ED Discharge Orders    None       Breelyn Icard, Mayer Maskerourtney F, MD 01/16/17 [phone]

## 2017-03-20 DIAGNOSIS — E282 Polycystic ovarian syndrome: Secondary | ICD-10-CM | POA: Insufficient documentation

## 2017-03-20 DIAGNOSIS — N8500 Endometrial hyperplasia, unspecified: Secondary | ICD-10-CM | POA: Insufficient documentation

## 2017-06-08 DIAGNOSIS — R400 Somnolence: Secondary | ICD-10-CM | POA: Insufficient documentation

## 2017-09-04 IMAGING — US US EXTREM LOW VENOUS*L*
1 series · 13 of 24 positions shown · non-contrast
Comparison: None.

CLINICAL DATA: 29-year-old female with 5 day history of left calf
pain. Prior history of DVT.



[Series 1: us extrem low venous*left* · 0.11mm/px · 13 of 27 slices shown]
[im 1/27]
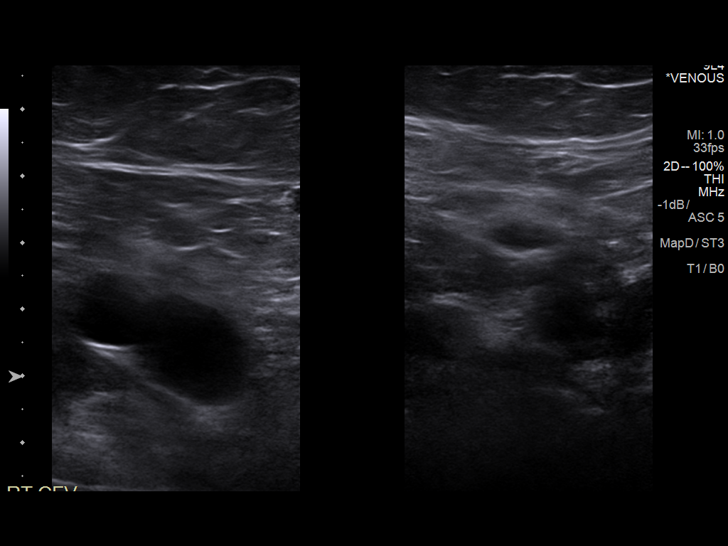
[im 3/27]
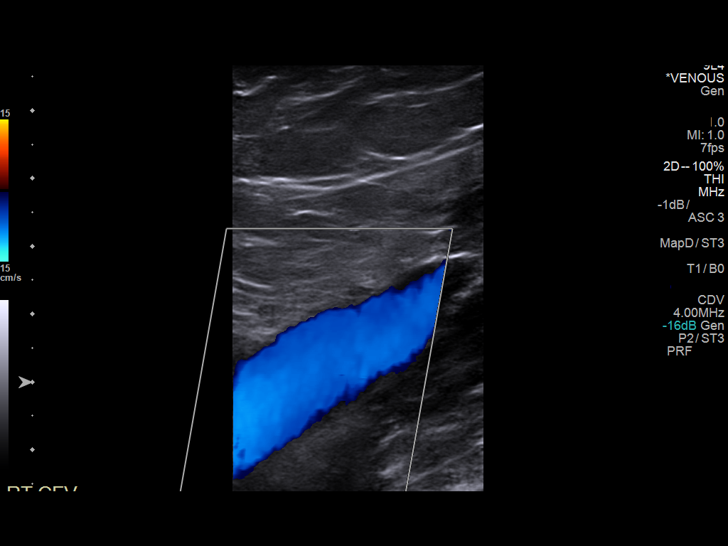
[im 5/27]
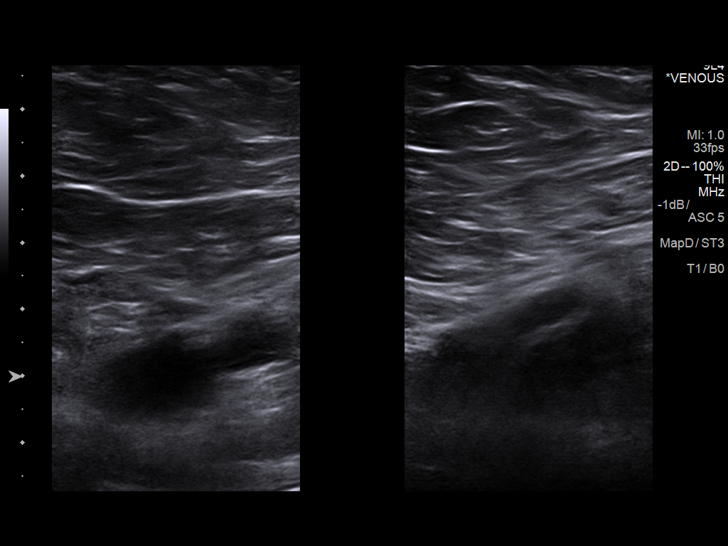
[im 7/27]
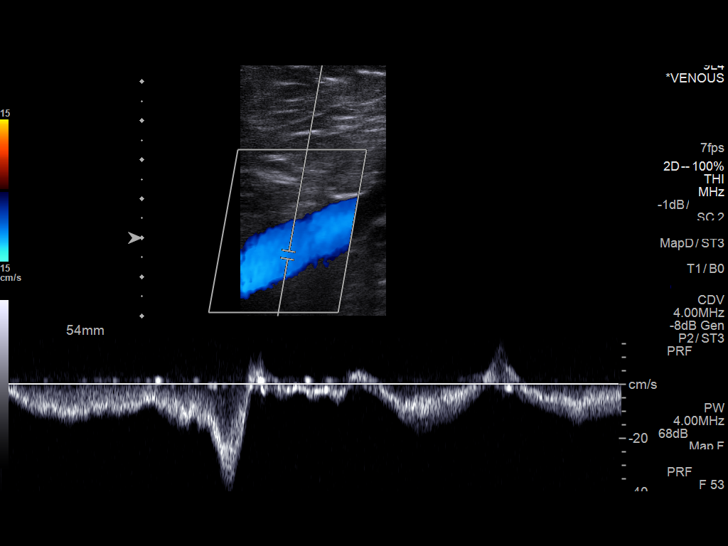
[im 10/27]
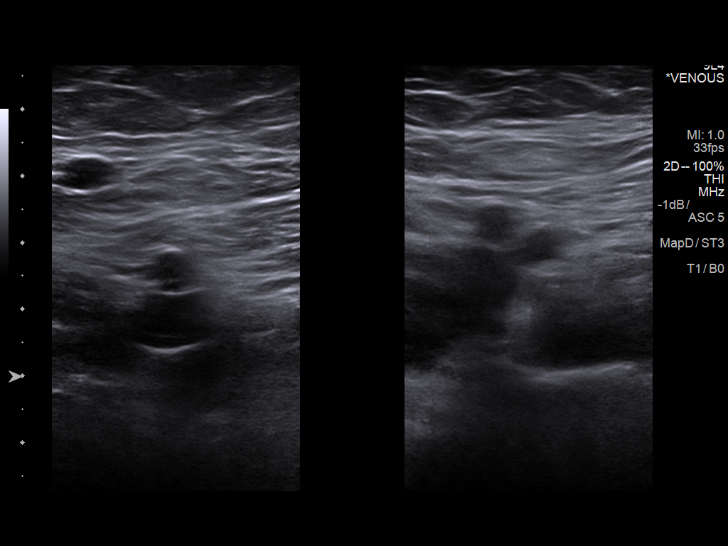
[im 12/27]
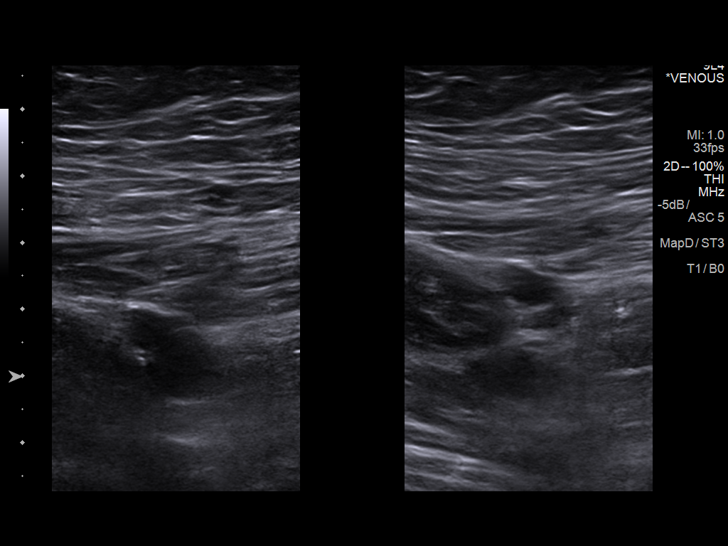
[im 14/27]
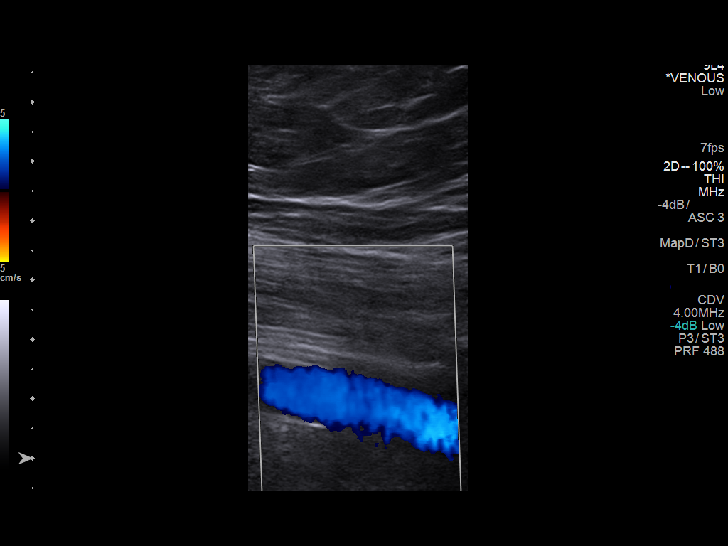
[im 15/27]
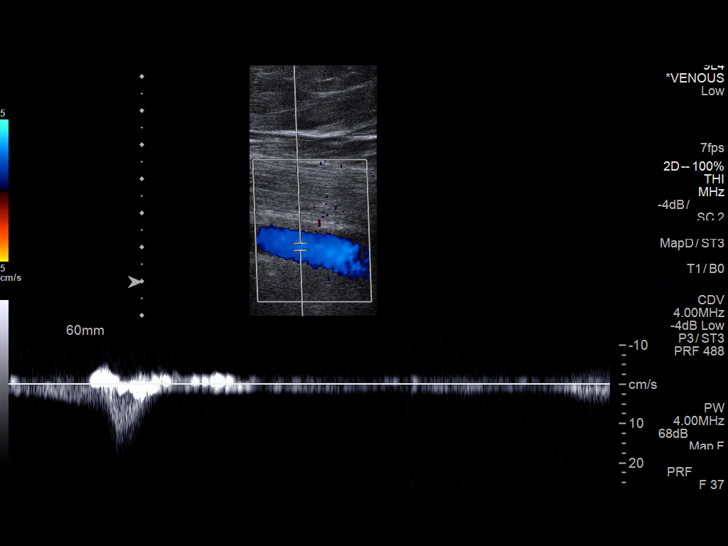
[im 17/27]
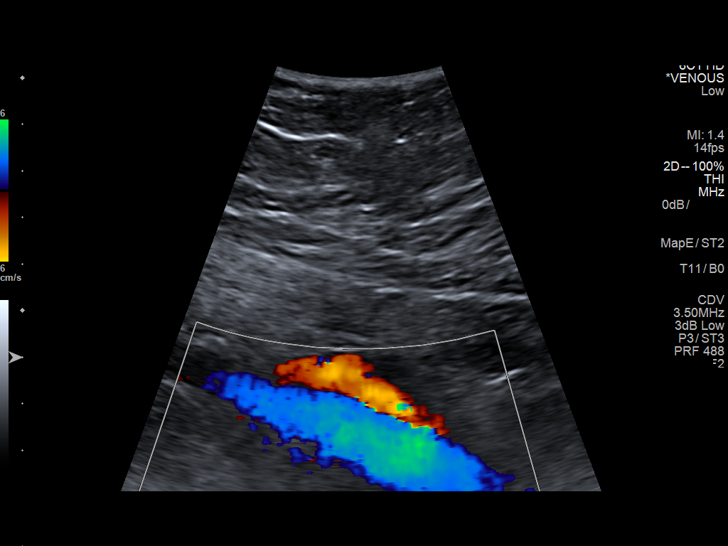
[im 20/27]
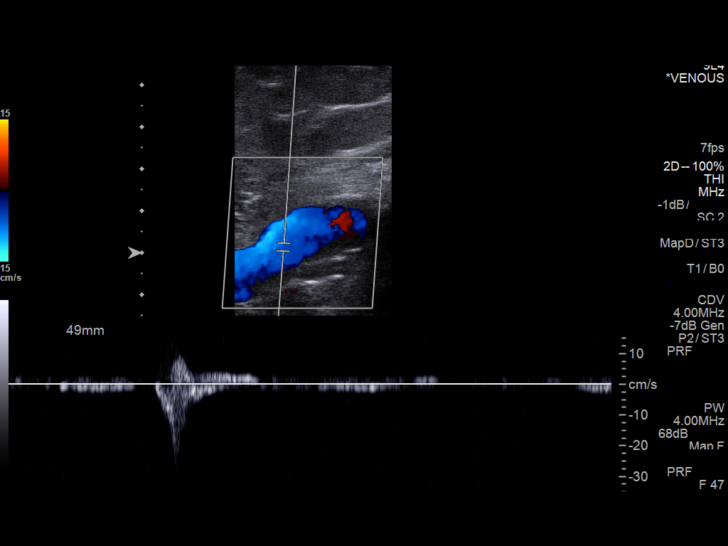
[im 22/27]
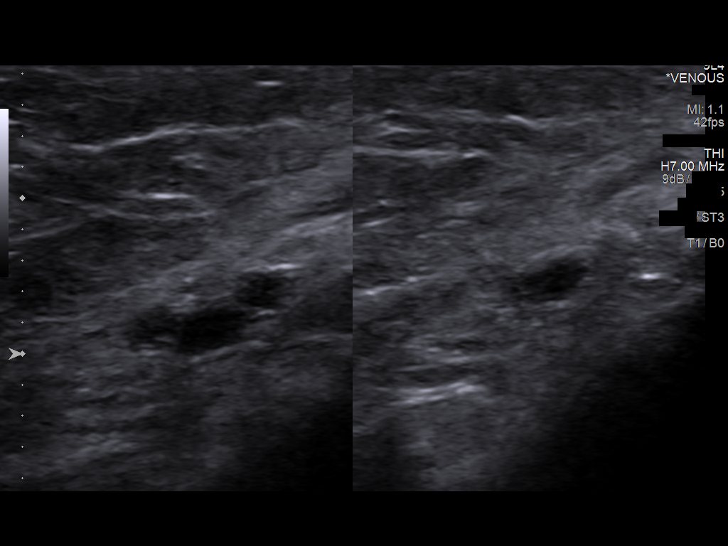
[im 24/27]
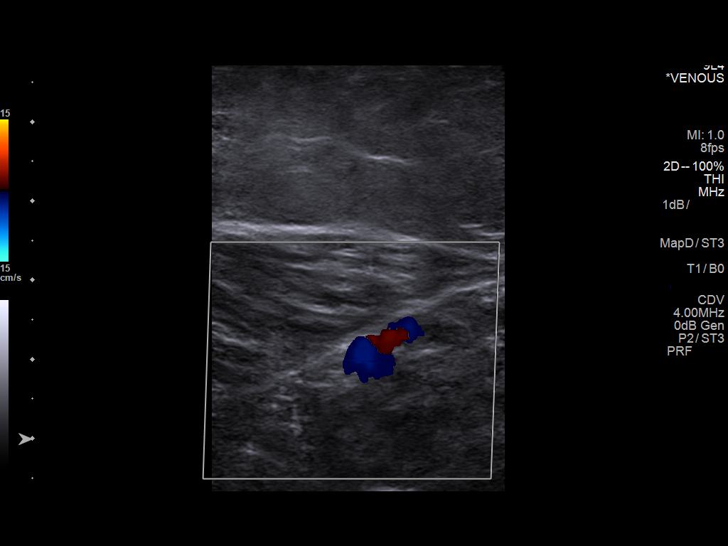
[im 27/27]
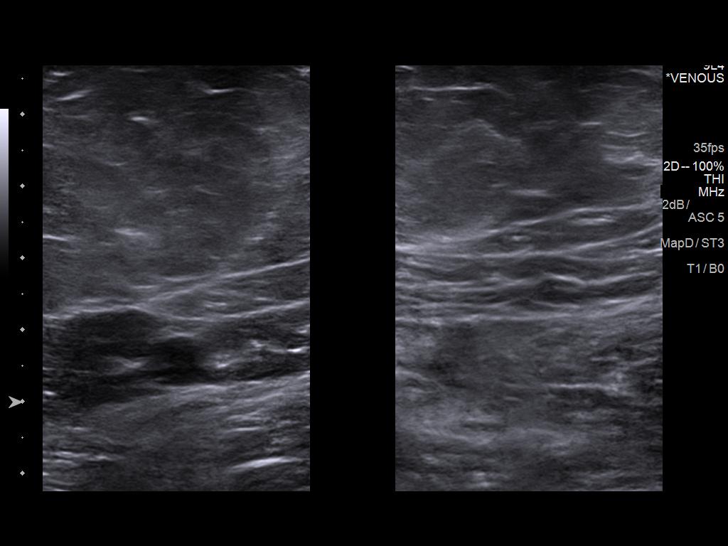

[13 of 24 positions shown; findings below may reference images not displayed]

FINDINGS: Contralateral Common Femoral Vein: Respiratory phasicity is normal
and symmetric with the symptomatic side. No evidence of thrombus.
Normal compressibility.

Common Femoral Vein: No evidence of thrombus. Normal
compressibility, respiratory phasicity and response to augmentation.

Saphenofemoral Junction: No evidence of thrombus. Normal
compressibility and flow on color Doppler imaging.

Profunda Femoral Vein: No evidence of thrombus. Normal
compressibility and flow on color Doppler imaging.

Femoral Vein: No evidence of thrombus. Normal compressibility,
respiratory phasicity and response to augmentation.

Popliteal Vein: No evidence of thrombus. Normal compressibility,
respiratory phasicity and response to augmentation.

Calf Veins: Visualization of the calf veins is somewhat limited
secondary to patient body habitus. The posterior tibial and
gastrocnemius veins are patent and compressible. The peroneal veins
are not seen.

Superficial Great Saphenous Vein: No evidence of thrombus. Normal
compressibility and flow on color Doppler imaging.

Venous Reflux:  None.

Other Findings:  None.
IMPRESSION: No evidence of deep venous thrombosis.

## 2017-10-02 IMAGING — US US EXTREM LOW VENOUS*L*
1 series · 13 of 24 positions shown · non-contrast
Comparison: None.

CLINICAL DATA: Left calf pain radiating to the ankle since [DATE].
History of prior DVT. Evaluate for acute or chronic DVT.



[Series 1: us extrem low venous*left* · 0.10mm/px · 13 of 39 slices shown]
[im 1/39]
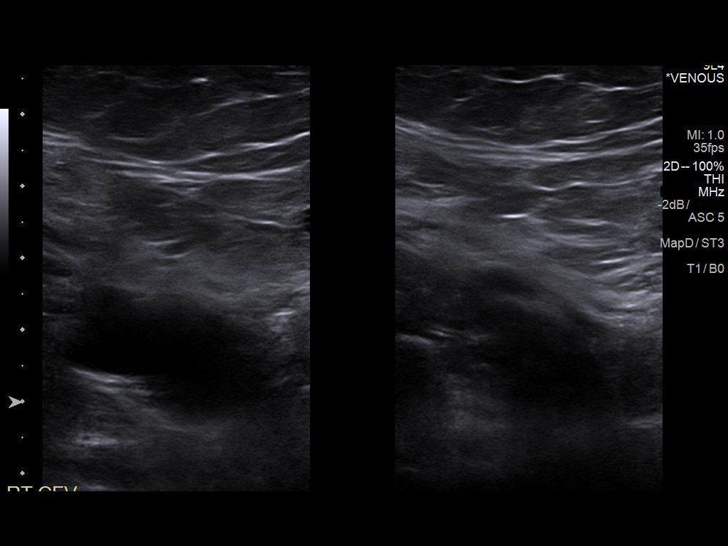
[im 4/39]
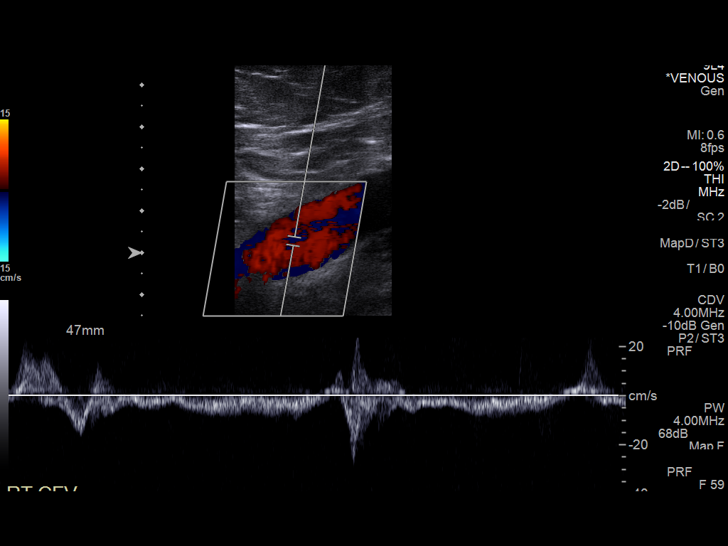
[im 7/39]
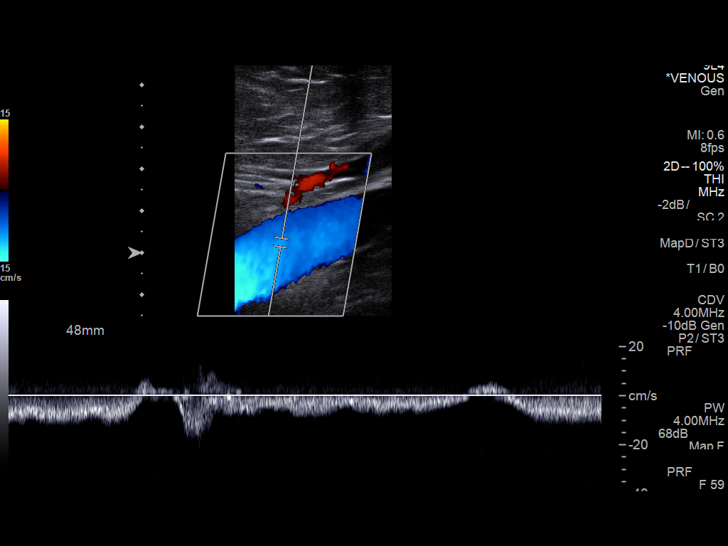
[im 10/39]
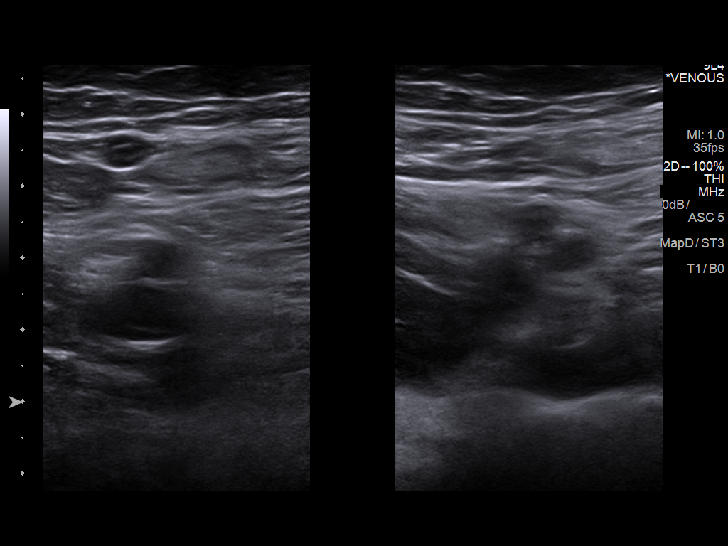
[im 14/39]
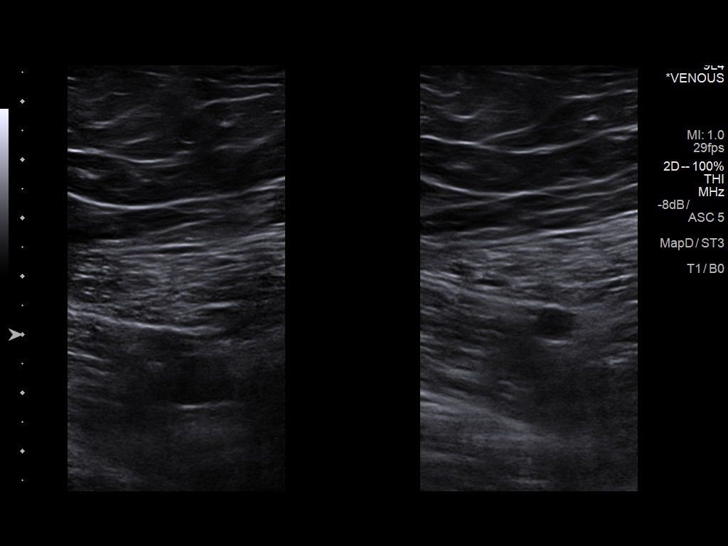
[im 17/39]
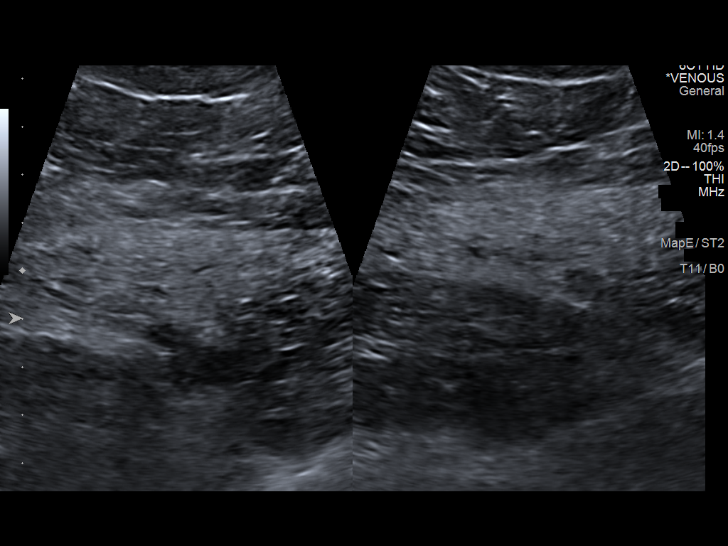
[im 20/39]
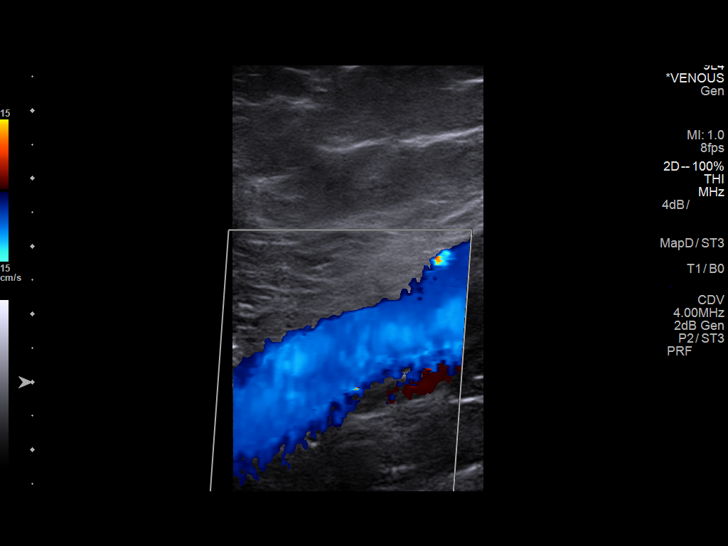
[im 22/39]
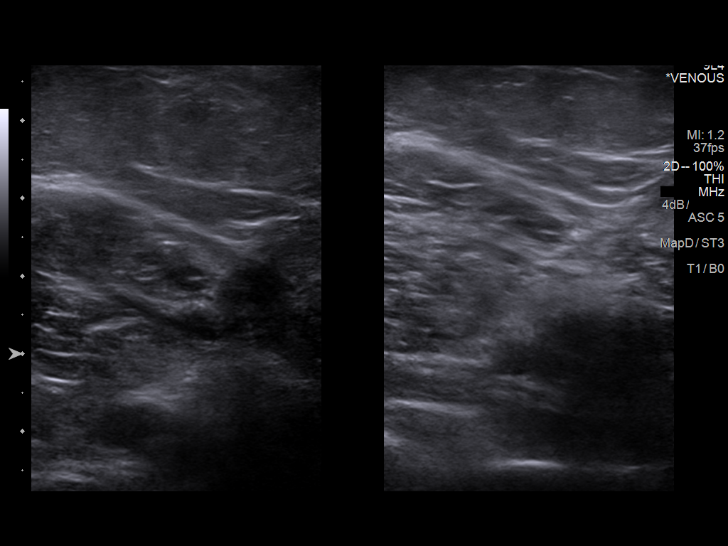
[im 25/39]
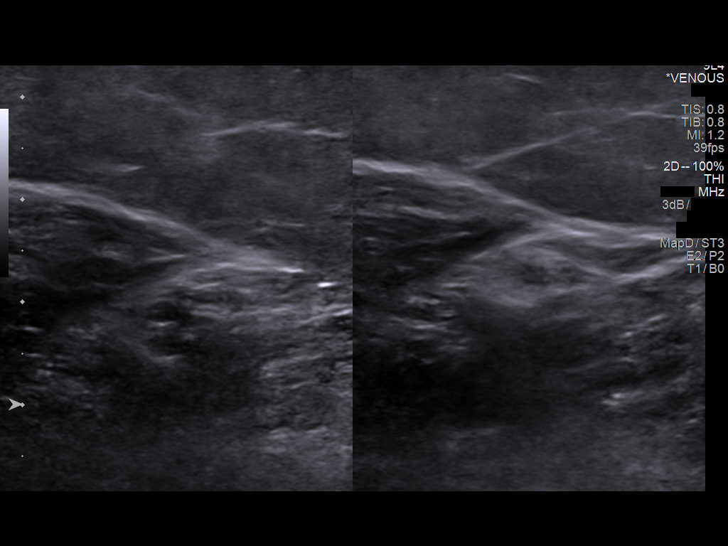
[im 29/39]
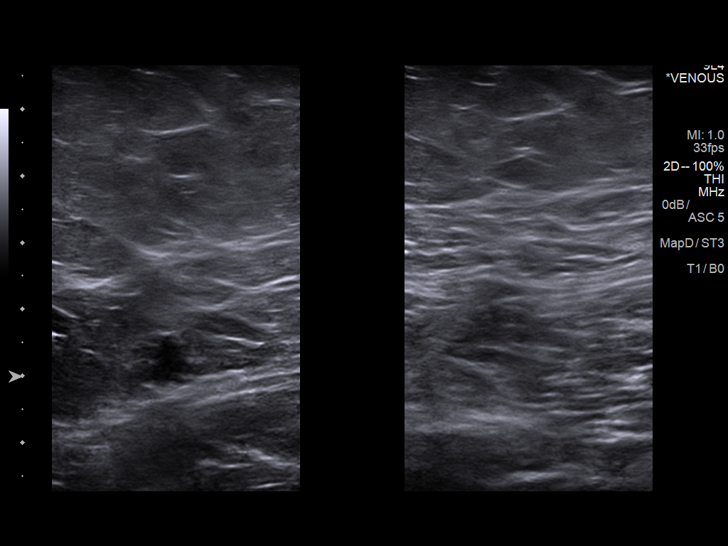
[im 32/39]
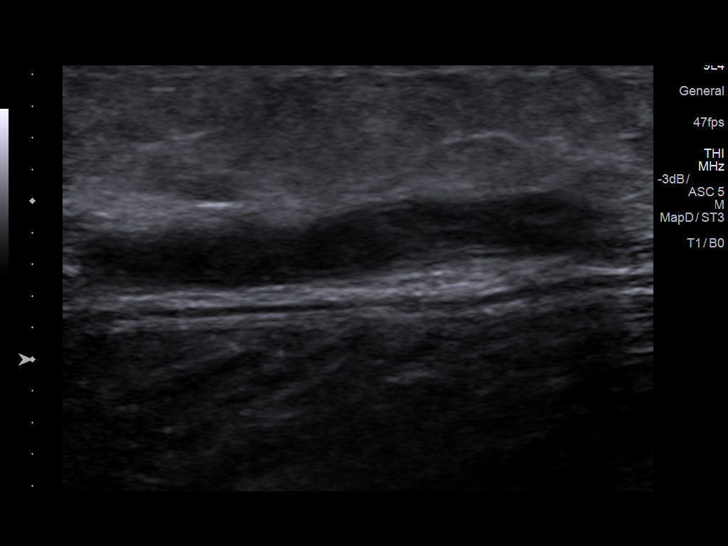
[im 35/39]
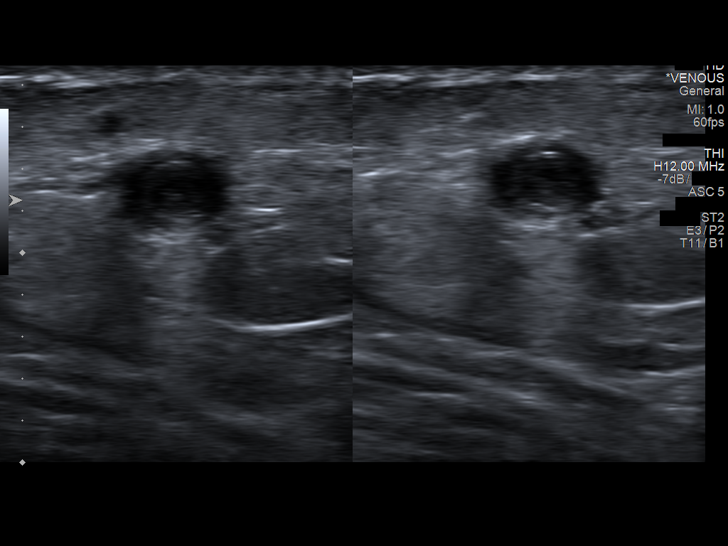
[im 39/39]
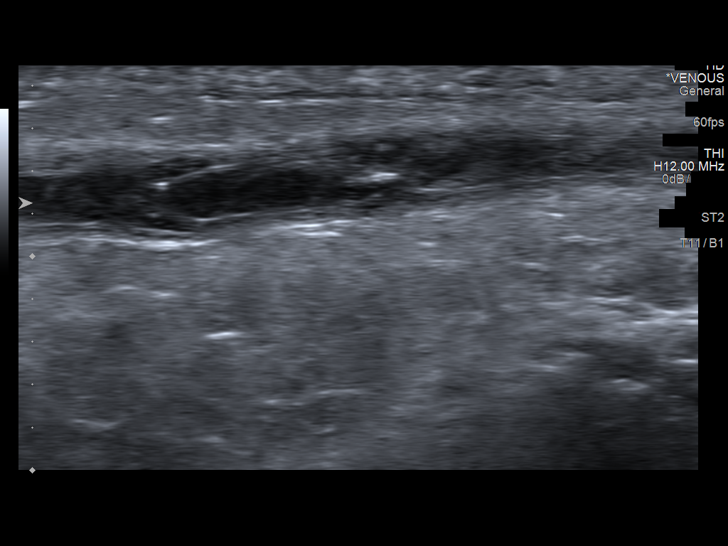

[13 of 24 positions shown; findings below may reference images not displayed]

FINDINGS: Contralateral Common Femoral Vein: Respiratory phasicity is normal
and symmetric with the symptomatic side. No evidence of thrombus.
Normal compressibility.

Common Femoral Vein: No evidence of thrombus. Normal
compressibility, respiratory phasicity and response to augmentation.

Saphenofemoral Junction: No evidence of thrombus. Normal
compressibility and flow on color Doppler imaging.

Profunda Femoral Vein: No evidence of thrombus. Normal
compressibility and flow on color Doppler imaging.

Femoral Vein: No evidence of thrombus. Normal compressibility,
respiratory phasicity and response to augmentation.

Popliteal Vein: No evidence of thrombus. Normal compressibility,
respiratory phasicity and response to augmentation.

Calf Veins: No evidence of thrombus. Normal compressibility and flow
on color Doppler imaging.

Superficial Great Saphenous Vein: No evidence of thrombus. Normal
compressibility and flow on color Doppler imaging.

Venous Reflux:  None.

Other Findings: There is echogenic occlusive thrombus within the
lesser saphenous vein extending from the level of the mid calf to
the ankle. Note is made of several echogenic foci within the distal
aspect of the thrombus (representative image 39) suggestive of
calcifications and indicative of a chronic etiology. Note, the left
lesser saphenous vein was not discretely imaged on the 01/27/2015
examination.
IMPRESSION: 1. No evidence of acute or chronic DVT within left lower extremity.
2. Examination is positive for age-indeterminate though potentially
chronic superficial thrombophlebitis within the lesser saphenous
vein at the level of the calf and ankle. Again, there is no
extension of this superficial thrombophlebitis to the deep venous
system of the left lower extremity.

## 2019-09-05 ENCOUNTER — Emergency Department (HOSPITAL_BASED_OUTPATIENT_CLINIC_OR_DEPARTMENT_OTHER)
Admission: EM | Admit: 2019-09-05 | Discharge: 2019-09-05 | Discharge status: Against Medical Advice | Payer: Self-pay | Attending: Emergency Medicine | Admitting: Emergency Medicine

## 2019-09-05 ENCOUNTER — Encounter (HOSPITAL_BASED_OUTPATIENT_CLINIC_OR_DEPARTMENT_OTHER): Payer: Self-pay | Admitting: Emergency Medicine

## 2019-09-05 ENCOUNTER — Emergency Department (HOSPITAL_BASED_OUTPATIENT_CLINIC_OR_DEPARTMENT_OTHER): Payer: Self-pay

## 2019-09-05 ENCOUNTER — Other Ambulatory Visit: Payer: Self-pay

## 2019-09-05 DIAGNOSIS — R0789 Other chest pain: Secondary | ICD-10-CM | POA: Insufficient documentation

## 2019-09-05 DIAGNOSIS — Z5321 Procedure and treatment not carried out due to patient leaving prior to being seen by health care provider: Secondary | ICD-10-CM | POA: Insufficient documentation

## 2019-09-05 DIAGNOSIS — Z87891 Personal history of nicotine dependence: Secondary | ICD-10-CM | POA: Insufficient documentation

## 2019-09-05 LAB — BASIC METABOLIC PANEL
Anion gap: 9 (ref 5–15)
BUN: 14 mg/dL (ref 6–20)
CO2: 26 mmol/L (ref 22–32)
Calcium: 8.6 mg/dL — ABNORMAL LOW (ref 8.9–10.3)
Chloride: 103 mmol/L (ref 98–111)
Creatinine, Ser: 0.79 mg/dL (ref 0.44–1.00)
GFR calc Af Amer: >60 mL/min (ref >60)
GFR calc non Af Amer: >60 mL/min (ref >60)
Glucose, Bld: 96 mg/dL (ref 70–99)
Potassium: 3.6 mmol/L (ref 3.5–5.1)
Sodium: 138 mmol/L (ref 135–145)

## 2019-09-05 LAB — CBC
HCT: 41 % (ref 36.0–46.0)
Hemoglobin: 13.7 g/dL (ref 12.0–15.0)
MCH: 31.7 pg (ref 26.0–34.0)
MCHC: 33.4 g/dL (ref 30.0–36.0)
MCV: 94.9 fL (ref 80.0–100.0)
Platelets: 170 10*3/uL (ref 150–400)
RBC: 4.32 MIL/uL (ref 3.87–5.11)
RDW: 12.3 % (ref 11.5–15.5)
WBC: 5.8 10*3/uL (ref 4.0–10.5)
nRBC: 0 % (ref 0.0–0.2)

## 2019-09-05 LAB — TROPONIN I (HIGH SENSITIVITY): Troponin I (High Sensitivity): 2 ng/L (ref <18)

## 2019-09-05 LAB — D-DIMER, QUANTITATIVE: D-Dimer, Quant: 0.3 ug/mL-FEU (ref 0.00–0.50)

## 2019-09-05 MED ORDER — SODIUM CHLORIDE 0.9% FLUSH
3.0000 mL | Freq: Once | INTRAVENOUS | Status: DC
  Filled 2019-09-05: qty 3

## 2019-09-05 NOTE — ED Provider Notes (Signed)
[facility] Provider Note: Lowella Dell, MD, FACEP  CSN: [id number] MRN: [id number] ARRIVAL: 09/05/19 at 0442 ROOM: [facility]08/[facility]08   CHIEF COMPLAINT  Chest Pain   HISTORY OF PRESENT ILLNESS  09/05/19 5:57 AM Chelbie Jarnagin is a 34 y.o. female who woke up this morning just prior to arrival with pressure on her chest (she rated it a 4 out of 10), pain into her left arm, a sensation that she could not breathe, as well as tremulousness.  Nothing made her symptoms better or worse. Her symptoms have subsequently resolved.  She has a remote history of DVT but denies leg pain or swelling.   Past Medical History:  Diagnosis Date  . DVT (deep venous thrombosis) (HCC)     Past Surgical History:  Procedure Laterality Date  . CESAREAN SECTION    . CESAREAN SECTION      Family History  Problem Relation Age of Onset  . Heart attack Mother   . Heart attack Father     Social History   Tobacco Use  . Smoking status: Former Games developer  . Smokeless tobacco: Never Used  Vaping Use  . Vaping Use: Never used  Substance Use Topics  . Alcohol use: No  . Drug use: No    Prior to Admission medications   Medication Sig Start Date End Date Taking? Authorizing Provider  medroxyPROGESTERone (PROVERA) 10 MG tablet Take 20 mg by mouth daily.    [provider]    Allergies Patient has no known allergies.   REVIEW OF SYSTEMS  Negative except as noted here or in the History of Present Illness.   PHYSICAL EXAMINATION  Initial Vital Signs Blood pressure (!) 155/78, pulse 70, temperature 98.1 F (36.7 C), temperature source Oral, resp. rate 16, height 5\' 7"  (1.702 m), weight (!) 154.2 kg, last menstrual period 08/12/2019, SpO2 100 %.  Examination General: Well-developed, obese female in no acute distress; appearance consistent with age of record HENT: normocephalic; atraumatic Eyes: pupils equal, round and reactive to light; extraocular muscles intact Neck: supple Heart:  regular rate and rhythm Lungs: clear to auscultation bilaterally Abdomen: soft; nondistended; nontender; bowel sounds present Extremities: No deformity; full range of motion; pulses normal; no edema Neurologic: Awake, alert and oriented; motor function intact in all extremities and symmetric; no facial droop Skin: Warm and dry Psychiatric: Normal mood and affect   RESULTS  Summary of this visit's results, reviewed and interpreted by myself:   EKG Interpretation  Date/Time:  Thursday September 05 2019 05:06:01 EDT Ventricular Rate:  65 PR Interval:    QRS Duration: 110 QT Interval:  420 QTC Calculation: 437 R Axis:   17 Text Interpretation: Sinus rhythm Normal ECG Rate is slower Confirmed by Phillips Goulette (02-17-1990) on 09/05/2019 5:22:01 AM      Laboratory Studies: Results for orders placed or performed during the hospital encounter of 09/05/19 (from the past 24 hour(s))  Basic metabolic panel     Status: Abnormal   Collection Time: 09/05/19  5:16 AM  Result Value Ref Range   Sodium 138 135 - 145 mmol/L   Potassium 3.6 3.5 - 5.1 mmol/L   Chloride 103 98 - 111 mmol/L   CO2 26 22 - 32 mmol/L   Glucose, Bld 96 70 - 99 mg/dL   BUN 14 6 - 20 mg/dL   Creatinine, Ser 11/05/19 0.44 - 1.00 mg/dL   Calcium 8.6 (L) 8.9 - 10.3 mg/dL   GFR calc non Af Amer >60 >60 mL/min  GFR calc Af Amer >60 >60 mL/min   Anion gap 9 5 - 15  CBC     Status: None   Collection Time: 09/05/19  5:16 AM  Result Value Ref Range   WBC 5.8 4.0 - 10.5 K/uL   RBC 4.32 3.87 - 5.11 MIL/uL   Hemoglobin 13.7 12.0 - 15.0 g/dL   HCT 51.7 36 - 46 %   MCV 94.9 80.0 - 100.0 fL   MCH 31.7 26.0 - 34.0 pg   MCHC 33.4 30.0 - 36.0 g/dL   RDW 61.6 07.3 - 71.0 %   Platelets 170 150 - 400 K/uL   nRBC 0.0 0.0 - 0.2 %  Troponin I (High Sensitivity)     Status: None   Collection Time: 09/05/19  5:16 AM  Result Value Ref Range   Troponin I (High Sensitivity) 2 <18 ng/L   Imaging Studies: DG Chest 2 View  Result Date:  09/05/2019 CLINICAL DATA:  Chest pain. EXAM: CHEST - 2 VIEW COMPARISON:  09/03/2011. FINDINGS: Mediastinum and hilar structures normal. Heart size normal low lung volumes. No focal infiltrate. No pleural effusion or pneumothorax. IMPRESSION: Low lung volumes.  No acute cardiopulmonary disease. Electronically Signed   By: Maisie Fus  Register   On: 09/05/2019 05:25    ED COURSE and MDM  Nursing notes, initial and subsequent vitals signs, including pulse oximetry, reviewed and interpreted by myself.  Vitals:   09/05/19 0456 09/05/19 0457  BP: (!) 155/78   Pulse: 70   Resp: 16   Temp: 98.1 F (36.7 C)   TempSrc: Oral   SpO2: 100%   Weight:  (!) 154.2 kg  Height:  5\' 7"  (1.702 m)   Medications  sodium chloride flush (NS) 0.9 % injection 3 mL (has no administration in time range)   6:36 AM Patient stated she needed to leave at 630 because she had to get to work by 8 AM as it was her second day on the job.  She was advised that her D-dimer and second troponin had not yet resulted.  Although her initial studies were normal she was advised we could not yet rule out a life-threatening condition such as pulmonary embolism.  Despite this she chose to leave AGAINST MEDICAL ADVICE.   PROCEDURES  Procedures   ED DIAGNOSES     ICD-10-CM   1. Eloped from emergency department  Z53.21   2. Atypical chest pain  R07.89        04-10-1987, MD 09/05/19 [phone]

## 2019-09-05 NOTE — ED Notes (Signed)
  Patient asked if she could leave before lab results were back.  Patient stated she had to be at work a 0800 and could not be late.  Dr Read Drivers and myself both spoke with patient about the risks of leaving AMA but patient stated she would follow up later.

## 2019-09-05 NOTE — ED Triage Notes (Signed)
Pt states she woke up this morning with chest pressure, felt like she could not breathe, and was shaky  Pt also reports she had pain in her left arm  Pt states the pain is not as bad now

## 2021-04-16 DIAGNOSIS — G44209 Tension-type headache, unspecified, not intractable: Secondary | ICD-10-CM | POA: Insufficient documentation

## 2021-04-16 DIAGNOSIS — R600 Localized edema: Secondary | ICD-10-CM | POA: Insufficient documentation

## 2022-06-13 ENCOUNTER — Other Ambulatory Visit: Payer: Self-pay

## 2022-06-13 ENCOUNTER — Emergency Department (HOSPITAL_BASED_OUTPATIENT_CLINIC_OR_DEPARTMENT_OTHER): Payer: 59

## 2022-06-13 ENCOUNTER — Encounter (HOSPITAL_BASED_OUTPATIENT_CLINIC_OR_DEPARTMENT_OTHER): Payer: Self-pay | Admitting: Urology

## 2022-06-13 ENCOUNTER — Emergency Department (HOSPITAL_BASED_OUTPATIENT_CLINIC_OR_DEPARTMENT_OTHER)
Admission: EM | Admit: 2022-06-13 | Discharge: 2022-06-13 | Disposition: A | Discharge status: Home / Self Care | Payer: 59 | Attending: Emergency Medicine | Admitting: Emergency Medicine

## 2022-06-13 DIAGNOSIS — K449 Diaphragmatic hernia without obstruction or gangrene: Secondary | ICD-10-CM

## 2022-06-13 DIAGNOSIS — M79661 Pain in right lower leg: Secondary | ICD-10-CM | POA: Diagnosis not present

## 2022-06-13 DIAGNOSIS — R002 Palpitations: Secondary | ICD-10-CM | POA: Diagnosis not present

## 2022-06-13 DIAGNOSIS — J40 Bronchitis, not specified as acute or chronic: Secondary | ICD-10-CM

## 2022-06-13 DIAGNOSIS — R079 Chest pain, unspecified: Secondary | ICD-10-CM

## 2022-06-13 DIAGNOSIS — R0602 Shortness of breath: Secondary | ICD-10-CM | POA: Diagnosis not present

## 2022-06-13 DIAGNOSIS — R0789 Other chest pain: Secondary | ICD-10-CM | POA: Insufficient documentation

## 2022-06-13 LAB — BASIC METABOLIC PANEL
Anion gap: 9 (ref 5–15)
BUN: 13 mg/dL (ref 6–20)
CO2: 23 mmol/L (ref 22–32)
Calcium: 8.7 mg/dL — ABNORMAL LOW (ref 8.9–10.3)
Chloride: 104 mmol/L (ref 98–111)
Creatinine, Ser: 0.72 mg/dL (ref 0.44–1.00)
GFR, Estimated: >60 mL/min (ref >60)
Glucose, Bld: 112 mg/dL — ABNORMAL HIGH (ref 70–99)
Potassium: 3.9 mmol/L (ref 3.5–5.1)
Sodium: 136 mmol/L (ref 135–145)

## 2022-06-13 LAB — CBC
HCT: 43.1 % (ref 36.0–46.0)
Hemoglobin: 14.3 g/dL (ref 12.0–15.0)
MCH: 29.5 pg (ref 26.0–34.0)
MCHC: 33.2 g/dL (ref 30.0–36.0)
MCV: 89 fL (ref 80.0–100.0)
Platelets: 229 10*3/uL (ref 150–400)
RBC: 4.84 MIL/uL (ref 3.87–5.11)
RDW: 13 % (ref 11.5–15.5)
WBC: 8 10*3/uL (ref 4.0–10.5)
nRBC: 0 % (ref 0.0–0.2)

## 2022-06-13 LAB — TROPONIN I (HIGH SENSITIVITY)
Troponin I (High Sensitivity): <2 ng/L (ref <18)
Troponin I (High Sensitivity): <2 ng/L (ref <18)

## 2022-06-13 LAB — PREGNANCY, URINE: Preg Test, Ur: NEGATIVE

## 2022-06-13 IMAGING — CR DG CHEST 2V
2 series · 2 of 2 positions shown · non-contrast
Comparison: 09/03/2011.

CLINICAL DATA: Chest pain.

EXAM:
CHEST - 2 VIEW

[w chest pa *]
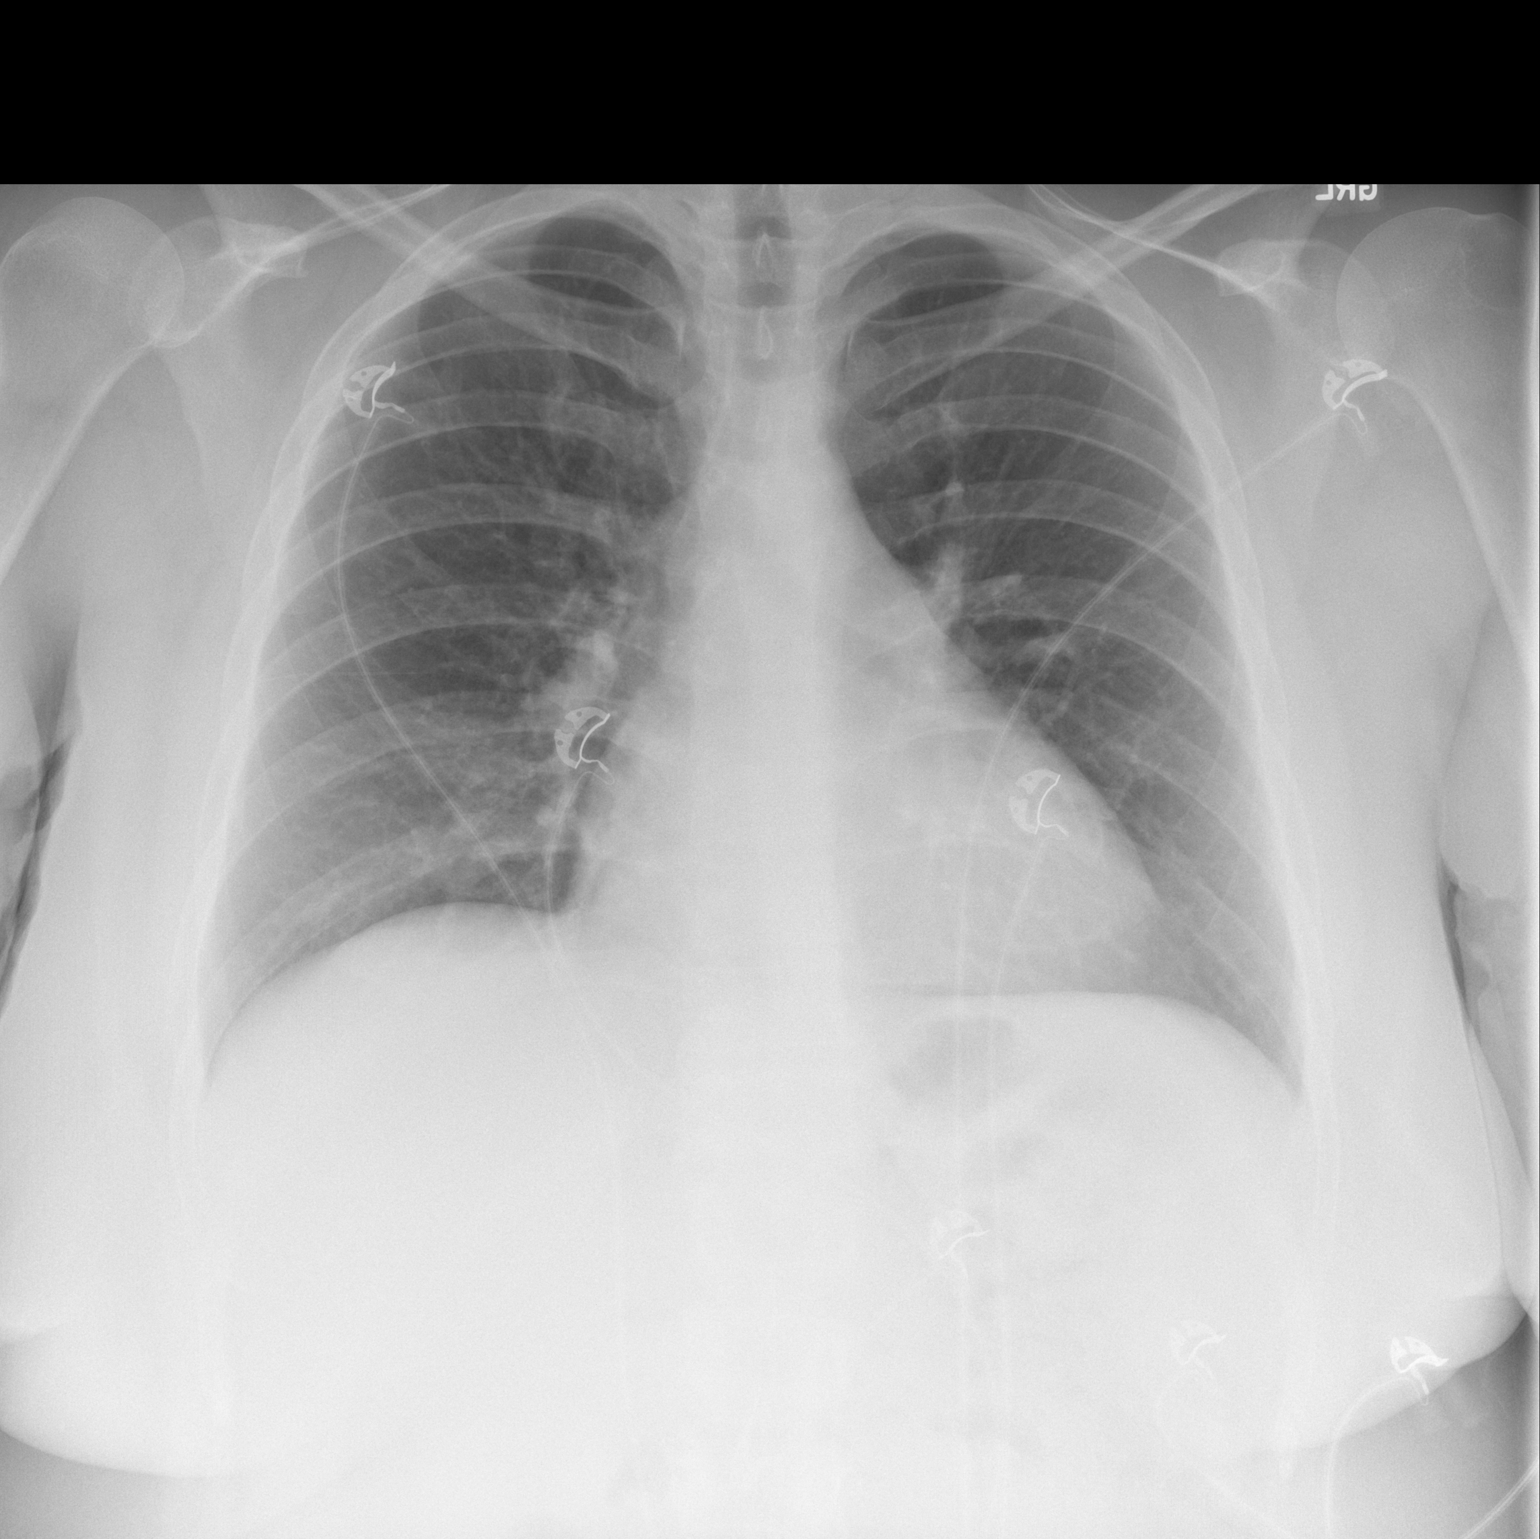

[w chest lat]
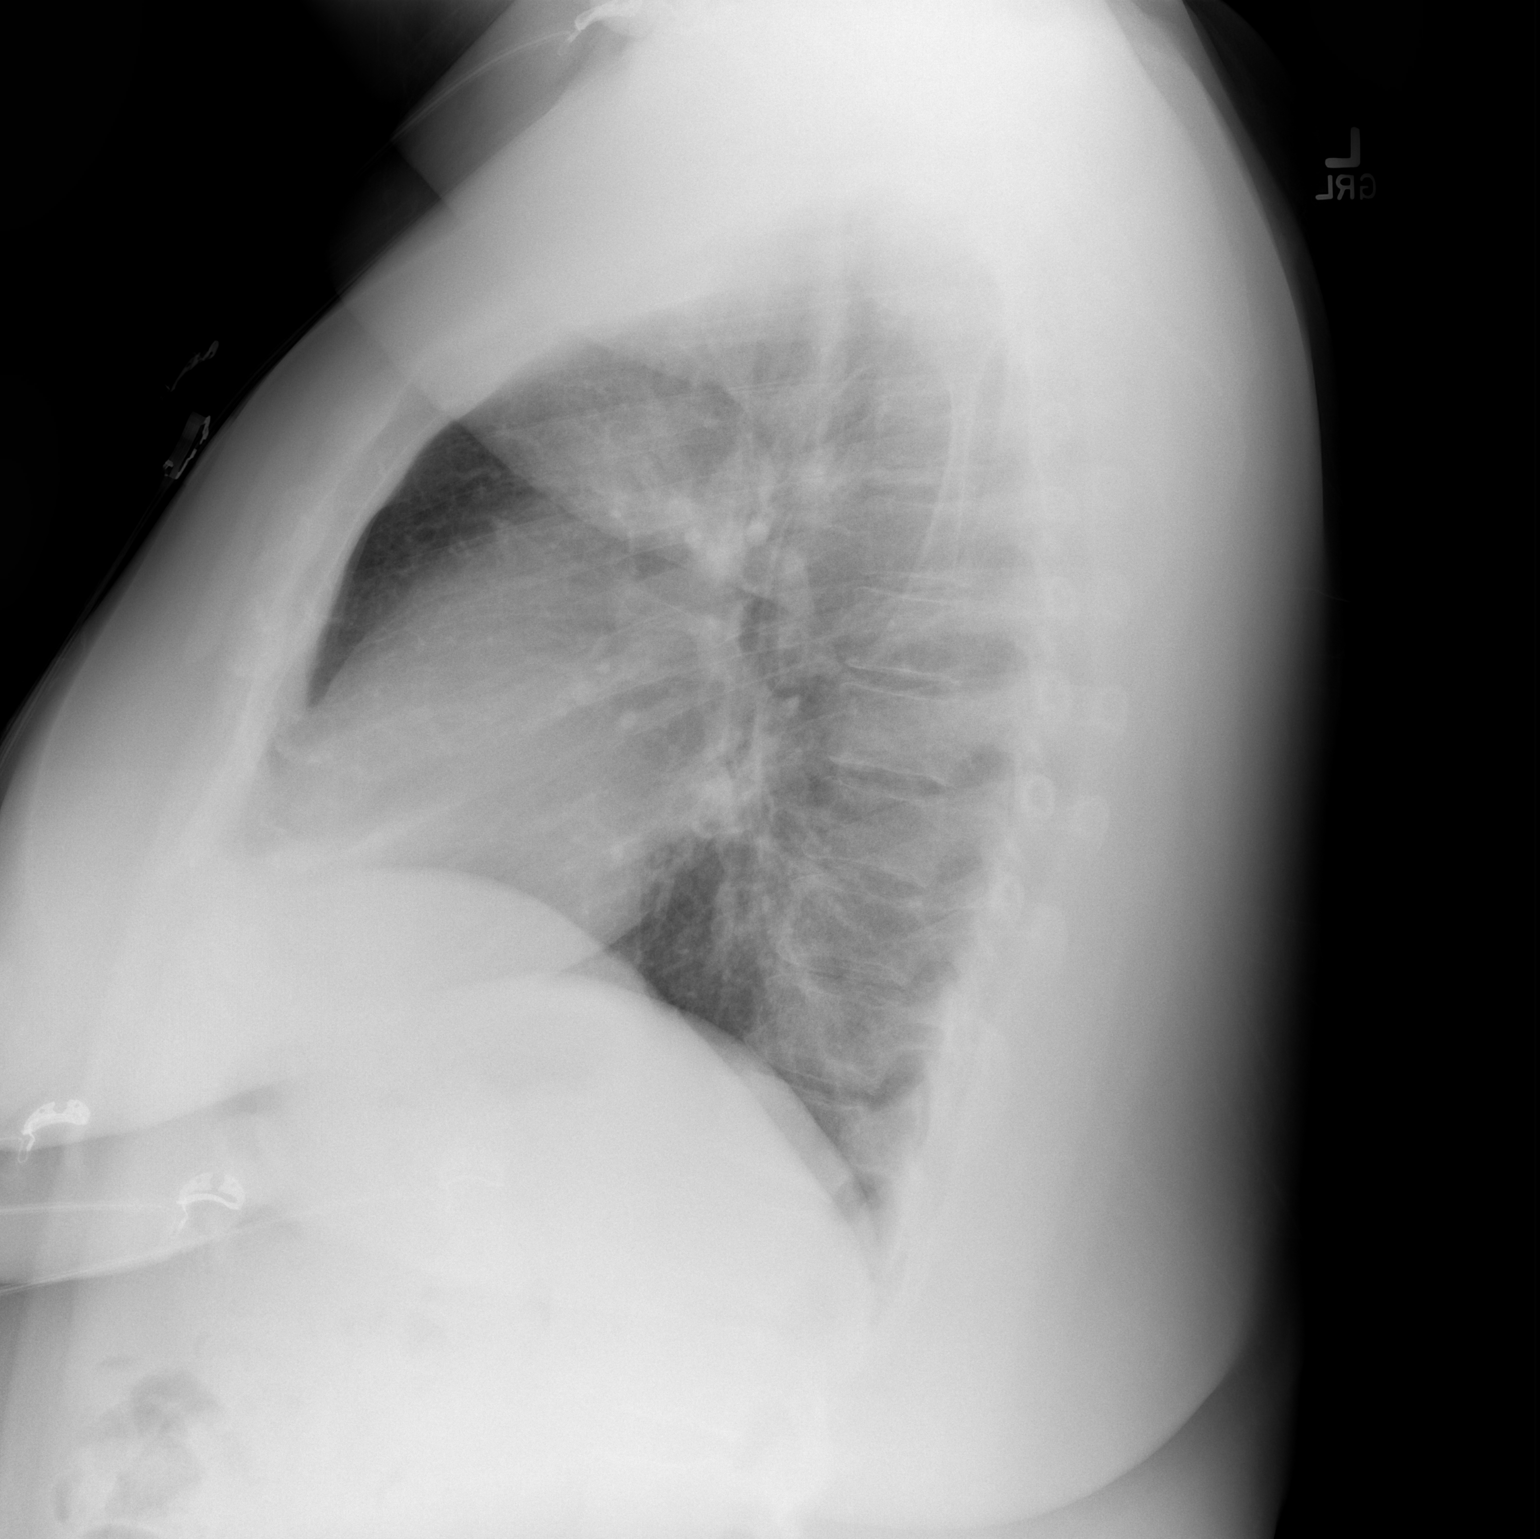

[2 of 2 positions shown; findings below may reference images not displayed]

FINDINGS: Mediastinum and hilar structures normal. Heart size normal low lung
volumes. No focal infiltrate. No pleural effusion or pneumothorax.
IMPRESSION: Low lung volumes.  No acute cardiopulmonary disease.

## 2022-06-13 MED ORDER — IOHEXOL 350 MG/ML SOLN
100.0000 mL | Freq: Once | INTRAVENOUS | Status: AC | PRN
  Administered 2022-06-13: 100 mL via INTRAVENOUS

## 2022-06-13 MED ORDER — PANTOPRAZOLE SODIUM 20 MG PO TBEC: 40.0000 mg | DELAYED_RELEASE_TABLET | Freq: Every day | ORAL | 0 refills | Status: DC

## 2022-06-13 MED ORDER — DEXAMETHASONE SODIUM PHOSPHATE 10 MG/ML IJ SOLN
10.0000 mg | Freq: Once | INTRAMUSCULAR | Status: AC
  Administered 2022-06-13: 10 mg via INTRAVENOUS
  Filled 2022-06-13: qty 1

## 2022-06-13 NOTE — ED Notes (Signed)
Up to BR.

## 2022-06-13 NOTE — ED Provider Notes (Signed)
[facility] Provider Note   CSN: [id number] Arrival date & time: 06/13/22  1419     History {Add pertinent medical, surgical, social history, OB history to HPI:1} Chief Complaint  Patient presents with   Chest Pain    Felicia Michael is a 37 y.o. female.  HPI     37 year old female with a history of obesity, reported history of DVT when she was 37 years old and on OCPs, who presents with concern for chest pain and shortness of breath.  Had recent clinical diagnosis of superficial thrombosis at urgent care and February and March.  Last Sunday and Monday began to have chest discomfort Woke up in middle of the night diaphoretic, heart racing, chest tightness Changed diet last Monday, lost 10lb of water weight since last Monday, was feeling better. Went to bed feeling ok but then 3AM woke up with heart racing, left arm hurting chest tightness, then after about 10-15 minutes tightness started to get better, went away.  Feeling shortness of breath at that time.  No nausea, vomiting.     Has had blood clot issue in leg for a few months, worried it is not getting better, is worse if sitting for longer period of time.  WHen it first started she was clinically diagnosed with superficial thrombophlebitis (no [place]).  Has been hurting in right leg since February/March, at first was very red and swollen, improved but not gone. In February was sick and then after that had clot, thinks had COVID then possibly. Last week was sick, did get up and walk.  Started walking in neighborhood,at first had chest tightntess but now it is improving with more activity. Has had cough, congestion .  No fever now. No orthopnea   Was 37yo and on birth control and was diagnosed with DVT per patient, doesn't remember blood thinner but had a recheck and it was better.    No hx of htn, hlpd, DM Dad had MI in early 54s.  Mom has hip issues, dad was smoker and used drugs  Has  not smoked in 10-13 years, previously smoked. Only previous surgery cs.    Past Medical History:  Diagnosis Date   DVT (deep venous thrombosis) (HCC)      Home Medications Prior to Admission medications   Medication Sig Start Date End Date Taking? Authorizing Provider  medroxyPROGESTERone (PROVERA) 10 MG tablet Take 20 mg by mouth daily.    [provider]      Allergies    Patient has no known allergies.    Review of Systems   Review of Systems  Physical Exam Updated Vital Signs BP (!) 154/95 (BP Location: Left Arm)   Pulse 93   Temp 98.5 F (36.9 C)   Resp 20   Ht 5\' 7"  (1.702 m)   Wt (!) 197.8 kg   SpO2 95%   BMI 68.29 kg/m  Physical Exam Vitals and nursing note reviewed.  Constitutional:      General: She is not in acute distress.    Appearance: She is well-developed. She is not diaphoretic.  HENT:     Head: Normocephalic and atraumatic.  Eyes:     Conjunctiva/sclera: Conjunctivae normal.  Neck:     Vascular: No JVD.  Cardiovascular:     Rate and Rhythm: Normal rate and regular rhythm.     Heart sounds: Normal heart sounds. No murmur heard.    No friction rub. No gallop.  Pulmonary:  Effort: Pulmonary effort is normal. No respiratory distress.     Breath sounds: Normal breath sounds. No wheezing or rales.  Abdominal:     General: There is no distension.     Palpations: Abdomen is soft.     Tenderness: There is no abdominal tenderness. There is no guarding.  Musculoskeletal:     Cervical back: Normal range of motion.     Right lower leg: Tenderness present.  Skin:    General: Skin is warm and dry.     Findings: No erythema or rash.  Neurological:     Mental Status: She is alert and oriented to person, place, and time.     ED Results / Procedures / Treatments   Labs (all labs ordered are listed, but only abnormal results are displayed) Labs Reviewed  CBC  BASIC METABOLIC PANEL  PREGNANCY, URINE  TROPONIN I (HIGH SENSITIVITY)     EKG EKG Interpretation  Date/Time:  Monday Jun 13 2022 14:33:32 EDT Ventricular Rate:  83 PR Interval:  165 QRS Duration: 85 QT Interval:  389 QTC Calculation: 458 R Axis:   42 Text Interpretation: Sinus rhythm No significant change since last tracing Confirmed by Melene Plan [phone]) on 06/13/2022 2:37:27 PM  Radiology No results found.  Procedures Procedures  {Document cardiac monitor, telemetry assessment procedure when appropriate:1}  Medications Ordered in ED Medications - No data to display  ED Course/ Medical Decision Making/ A&P   {   Click here for ABCD2, HEART and other calculatorsREFRESH Note before signing :1}                          37 year old female with a history of obesity, reported history of DVT when she was 37 years old and on OCPs, who presents with concern for chest pain and shortness of breath.    Differential diagnosis for chest pain includes pulmonary embolus, dissection, pneumothorax, pneumonia, ACS, myocarditis, pericarditis.    EKG was done and evaluate by me and showed no acute ST changes and no signs of pericarditis.   Chest x-ray was done and evaluated by me and radiology and showed no sign of pneumonia or pneumothorax.   History and exam not consistent with aortic dissection.  CTA PE study ordered given prior history of DVT, chest pain or shortness of breath with right leg pain, CT showing no sign of PE, does show some airway thickening to suggest bronchitis or reactive airway disease, mild cardiomegaly, and a suspected hiatal hernia.  Delta troponins are negative***and have low suspicion for ACS. Low risk HEART score.  Given cardiomegaly present, family history of early heart disease, episodes of palpitations, will refer to cardiology for further evaluation.  Will start PPI given some reflux symptoms, hiatal hernia.  Also notes continued cough, has bronchitis on CT- will do single dose of decadron.   DVT study completed given hx of DVT  and right leg pain shows ***.  Normal pulses, doubt acute arterial thrombus.   Patient discharged in stable condition with understanding of reasons to return.   {Document critical care time when appropriate:1} {Document review of labs and clinical decision tools ie heart score, Chads2Vasc2 etc:1}  {Document your independent review of radiology images, and any outside records:1} {Document your discussion with family members, caretakers, and with consultants:1} {Document social determinants of health affecting pt's care:1} {Document your decision making why or why not admission, treatments were needed:1} Final Clinical Impression(s) / ED Diagnoses Final diagnoses:  None  Rx / DC Orders ED Discharge Orders     None

## 2022-06-13 NOTE — ED Triage Notes (Signed)
Pt states last night woke up at 0300 with chest tightness and heart racing, reports some SOB as well  Had superficial blood clot in March

## 2022-07-07 NOTE — Progress Notes (Deleted)
Office Note     CC:  *** Requesting Provider:  Alvira Monday, MD  HPI: Felicia Michael is a 37 y.o. (08-13-1985) female presenting at the request of .[facility] ***  The pt is *** on a statin for cholesterol management.  The pt is *** on a daily aspirin.   Other AC:  *** The pt is *** on medication for hypertension.   The pt is *** diabetic.  Tobacco hx:  ***  Past Medical History:  Diagnosis Date   DVT (deep venous thrombosis) (HCC)     Past Surgical History:  Procedure Laterality Date   CESAREAN SECTION     CESAREAN SECTION      Social History   Socioeconomic History   Marital status: Single    Spouse name: Not on file   Number of children: Not on file   Years of education: Not on file   Highest education level: Not on file  Occupational History   Not on file  Tobacco Use   Smoking status: Former   Smokeless tobacco: Never  Vaping Use   Vaping Use: Never used  Substance and Sexual Activity   Alcohol use: No   Drug use: No   Sexual activity: Not on file  Other Topics Concern   Not on file  Social History Narrative   Not on file   Social Determinants of Health   Financial Resource Strain: Not on file  Food Insecurity: Not on file  Transportation Needs: Not on file  Physical Activity: Not on file  Stress: Not on file  Social Connections: Not on file  Intimate Partner Violence: Not on file   *** Family History  Problem Relation Age of Onset   Heart attack Mother    Heart attack Father     Current Outpatient Medications  Medication Sig Dispense Refill   medroxyPROGESTERone (PROVERA) 10 MG tablet Take 20 mg by mouth daily.     pantoprazole (PROTONIX) 20 MG tablet Take 2 tablets (40 mg total) by mouth daily for 14 days. 28 tablet 0   No current facility-administered medications for this visit.    No Known Allergies   REVIEW OF SYSTEMS:  *** [X]  denotes positive finding, [ ]  denotes negative finding Cardiac  Comments:   Chest pain or chest pressure:    Shortness of breath upon exertion:    Short of breath when lying flat:    Irregular heart rhythm:        Vascular    Pain in calf, thigh, or hip brought on by ambulation:    Pain in feet at night that wakes you up from your sleep:     Blood clot in your veins:    Leg swelling:         Pulmonary    Oxygen at home:    Productive cough:     Wheezing:         Neurologic    Sudden weakness in arms or legs:     Sudden numbness in arms or legs:     Sudden onset of difficulty speaking or slurred speech:    Temporary loss of vision in one eye:     Problems with dizziness:         Gastrointestinal    Blood in stool:     Vomited blood:         Genitourinary    Burning when urinating:     Blood in urine:  Psychiatric    Major depression:         Hematologic    Bleeding problems:    Problems with blood clotting too easily:        Skin    Rashes or ulcers:        Constitutional    Fever or chills:      PHYSICAL EXAMINATION:  There were no vitals filed for this visit.  General:  WDWN in NAD; vital signs documented above Gait: Not observed HENT: WNL, normocephalic Pulmonary: normal non-labored breathing , without wheezing Cardiac: {Desc; regular/irreg:14544} HR Abdomen: soft, NT, no masses Skin: {With/Without:20273} rashes Vascular Exam/Pulses:  Right Left  Radial {Exam; arterial pulse strength 0-4:30167} {Exam; arterial pulse strength 0-4:30167}  Ulnar {Exam; arterial pulse strength 0-4:30167} {Exam; arterial pulse strength 0-4:30167}  Femoral {Exam; arterial pulse strength 0-4:30167} {Exam; arterial pulse strength 0-4:30167}  Popliteal {Exam; arterial pulse strength 0-4:30167} {Exam; arterial pulse strength 0-4:30167}  DP {Exam; arterial pulse strength 0-4:30167} {Exam; arterial pulse strength 0-4:30167}  PT {Exam; arterial pulse strength 0-4:30167} {Exam; arterial pulse strength 0-4:30167}   Extremities: {With/Without:20273}  ischemic changes, {With/Without:20273} Gangrene , {With/Without:20273} cellulitis; {With/Without:20273} open wounds;  Musculoskeletal: no muscle wasting or atrophy  Neurologic: A&O X 3;  No focal weakness or paresthesias are detected Psychiatric:  The pt has {Desc; normal/abnormal:11317::"Normal"} affect.   Non-Invasive Vascular Imaging:   ***    ASSESSMENT/PLAN: Felicia Michael is a 37 y.o. female presenting with ***   ***   Victorino Sparrow, MD [facility] [phone]

## 2022-07-08 ENCOUNTER — Encounter: Payer: 59 | Admitting: Vascular Surgery

## 2022-07-11 ENCOUNTER — Other Ambulatory Visit: Payer: Self-pay

## 2022-07-11 DIAGNOSIS — I82409 Acute embolism and thrombosis of unspecified deep veins of unspecified lower extremity: Secondary | ICD-10-CM | POA: Insufficient documentation

## 2022-07-24 NOTE — Progress Notes (Deleted)
Cardiology Office Note:    Date:  07/24/2022   ID:  Felicia Michael, DOB 08-01-1985, MRN [id number]  PCP:  [facility]  Cardiologist:  Norman Herrlich, MD   Referring MD: Alvira Monday, MD  ASSESSMENT:    No diagnosis found. PLAN:    In order of problems listed above:  ***  Next appointment   Medication Adjustments/Labs and Tests Ordered: Current medicines are reviewed at length with the patient today.  Concerns regarding medicines are outlined above.  No orders of the defined types were placed in this encounter.  No orders of the defined types were placed in this encounter.    No chief complaint on file. ***  History of Present Illness:    Felicia Michael is a 37 y.o. female who is being seen today for the evaluation of chest pain at the request of Alvira Monday, MD.  He has had several emergency room visits with chest pain most recently 06/13/2022.  He was seen at [facility] health ED for chest pain and shortness of breath.  He has a background history of previous DVT in the emergency room and had CTA of the chest performed pulmonary embolism protocol with no findings of pulmonary embolism airway thickening was seen suggesting bronchitis and he had mild cardiomegaly.  There is no aortic or coronary atherosclerosis.  High-sensitivity troponin was low less than 2 CBC normal hemoglobin 14.3 BMP normal except for reduced calcium which seems to be a chronic problem 8.7 and mildly elevated glucose 112.  He had right lower extremity venous duplex performed which showed no findings of DVT and there was a's focal superficial thrombophlebitis of the right great saphenous vein or tributary near the ankle.  I independently reviewed the EKG showing sinus rhythm abnormal R wave progression V2 to V3 otherwise normal.  Chart review is cardiac echo performed [facility] 03/30/2017 conclusion normal left ventricular size function no ventricular enlargement normal ejection  fraction 55 to 60% he is day 48-hour monitor showing rare PVCs rare APCs no bradycardia and symptoms unassociated with arrhythmia. Past Medical History:  Diagnosis Date   Bilateral lower extremity edema 04/16/2021   Class 3 severe obesity due to excess calories without serious comorbidity with body mass index (BMI) greater than or equal to 70 in adult [facility]) 03/13/2017   DVT (deep venous thrombosis) (HCC)    Endometrial hyperplasia 03/20/2017   Formatting of this note might be different from the original. U/s 07/2017--endometrial lining 5.69cm. Formatting of this note might be different from the original. U/s 07/2017--endometrial lining 5.69cm.   Has daytime drowsiness 06/08/2017   PCOS (polycystic ovarian syndrome) 03/20/2017   Tension type headache 04/16/2021    Past Surgical History:  Procedure Laterality Date   CESAREAN SECTION      Current Medications: No outpatient medications have been marked as taking for the 07/25/22 encounter (Appointment) with Revankar, Aundra Dubin, MD.     Allergies:   Patient has no known allergies.   Social History   Socioeconomic History   Marital status: Single    Spouse name: Not on file   Number of children: Not on file   Years of education: Not on file   Highest education level: Not on file  Occupational History   Not on file  Tobacco Use   Smoking status: Former   Smokeless tobacco: Never  Vaping Use   Vaping Use: Never used  Substance and Sexual Activity   Alcohol use: No   Drug use: No  Sexual activity: Not on file  Other Topics Concern   Not on file  Social History Narrative   Not on file   Social Determinants of Health   Financial Resource Strain: Not on file  Food Insecurity: Not on file  Transportation Needs: Not on file  Physical Activity: Not on file  Stress: Not on file  Social Connections: Not on file     Family History: The patient's ***family history includes Heart attack in her father and mother.  ROS:   ROS  Please see the history of present illness.    *** All other systems reviewed and are negative.  EKGs/Labs/Other Studies Reviewed:    The following studies were reviewed today: ***      EKG:  EKG is *** ordered today.  The ekg ordered today is personally reviewed and demonstrates ***  Recent Labs: 06/13/2022: BUN 13; Creatinine, Ser 0.72; Hemoglobin 14.3; Platelets 229; Potassium 3.9; Sodium 136  Recent Lipid Panel No results found for: "CHOL", "TRIG", "HDL", "CHOLHDL", "VLDL", "LDLCALC", "LDLDIRECT"  Physical Exam:    VS:  There were no vitals taken for this visit.    Wt Readings from Last 3 Encounters:  06/13/22 (!) 436 lb (197.8 kg)  09/05/19 (!) 340 lb (154.2 kg)  01/15/17 (!) 460 lb (208.7 kg)     GEN: *** Well nourished, well developed in no acute distress HEENT: Normal NECK: No JVD; No carotid bruits LYMPHATICS: No lymphadenopathy CARDIAC: ***RRR, no murmurs, rubs, gallops RESPIRATORY:  Clear to auscultation without rales, wheezing or rhonchi  ABDOMEN: Soft, non-tender, non-distended MUSCULOSKELETAL:  No edema; No deformity  SKIN: Warm and dry NEUROLOGIC:  Alert and oriented x 3 PSYCHIATRIC:  Normal affect     Signed, Norman Herrlich, MD  07/24/2022 3:43 PM    [facility]

## 2022-07-25 ENCOUNTER — Ambulatory Visit: Payer: 59 | Attending: Cardiology | Admitting: Cardiology

## 2022-08-05 ENCOUNTER — Encounter: Payer: Self-pay | Admitting: General Practice
